# Patient Record
Sex: Female | Born: 1995 | Race: White | Hispanic: No | Marital: Single | Smoking: Former smoker
Health system: Southern US, Community
[De-identification: ages and names within clinical notes are randomized; demographics above are authoritative.]

## PROBLEM LIST (undated history)

## (undated) DIAGNOSIS — Z789 Other specified health status: Secondary | ICD-10-CM

## (undated) HISTORY — DX: Other specified health status: Z78.9

## (undated) HISTORY — PX: NO PAST SURGERIES: SHX2092

---
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
## 2017-07-20 LAB — OB RESULTS CONSOLE ANTIBODY SCREEN: Antibody Screen: NEGATIVE

## 2017-07-20 LAB — OB RESULTS CONSOLE RUBELLA ANTIBODY, IGM: Rubella: IMMUNE

## 2017-07-20 LAB — OB RESULTS CONSOLE RPR: RPR: NONREACTIVE

## 2017-07-20 LAB — OB RESULTS CONSOLE ABO/RH: RH TYPE: POSITIVE

## 2017-07-20 LAB — OB RESULTS CONSOLE HIV ANTIBODY (ROUTINE TESTING): HIV: NONREACTIVE

## 2017-07-20 LAB — OB RESULTS CONSOLE HEPATITIS B SURFACE ANTIGEN: HEP B S AG: NEGATIVE

## 2017-08-25 ENCOUNTER — Other Ambulatory Visit: Payer: Self-pay

## 2017-08-25 ENCOUNTER — Emergency Department
Admission: EM | Admit: 2017-08-25 | Discharge: 2017-08-25 | Disposition: A | Discharge status: Home / Self Care | Payer: Medicaid Other | Attending: Student in an Organized Health Care Education/Training Program | Admitting: Student in an Organized Health Care Education/Training Program

## 2017-08-25 ENCOUNTER — Encounter: Payer: Self-pay | Admitting: Emergency Medicine

## 2017-08-25 DIAGNOSIS — H60312 Diffuse otitis externa, left ear: Secondary | ICD-10-CM | POA: Insufficient documentation

## 2017-08-25 DIAGNOSIS — F17210 Nicotine dependence, cigarettes, uncomplicated: Secondary | ICD-10-CM | POA: Diagnosis not present

## 2017-08-25 DIAGNOSIS — H9202 Otalgia, left ear: Secondary | ICD-10-CM | POA: Diagnosis present

## 2017-08-25 MED ORDER — CLINDAMYCIN HCL 300 MG PO CAPS: 300.0000 mg | ORAL_CAPSULE | Freq: Three times a day (TID) | ORAL | 0 refills | Status: AC

## 2017-08-25 MED ORDER — CIPROFLOXACIN HCL 500 MG PO TABS: 500.0000 mg | ORAL_TABLET | Freq: Two times a day (BID) | ORAL | 0 refills | Status: DC

## 2017-08-25 MED ORDER — CIPROFLOXACIN HCL 0.3 % OP SOLN
2.0000 [drp] | Freq: Once | OPHTHALMIC | Status: AC
  Administered 2017-08-25: 2 [drp] via OTIC
  Filled 2017-08-25: qty 2.5

## 2017-08-25 MED ORDER — CLINDAMYCIN HCL 150 MG PO CAPS
300.0000 mg | ORAL_CAPSULE | Freq: Once | ORAL | Status: AC
  Administered 2017-08-25: 300 mg via ORAL
  Filled 2017-08-25: qty 2

## 2017-08-25 MED ORDER — CIPROFLOXACIN HCL 0.3 % OP SOLN: 1.0000 [drp] | OPHTHALMIC | 0 refills | Status: AC

## 2017-08-25 NOTE — ED Provider Notes (Signed)
[facility] Emergency Department Provider Note  ____________________________________________  Time seen: Approximately 11:34 PM  I have reviewed the triage vital signs and the nursing notes.   HISTORY  Chief Complaint Otalgia    HPI Samantha Vaughan is a 21 y.o. female presents to the emergency department with left ear pain.  Patient reports that she has had an edematous and tender left external auditory canal for the past week.  Patient reports that her symptoms started when she took a hairpin to the left external auditory canal to remove wax.  Patient has been taking Polytrim drops, which has not relieved her symptoms.  Patient has not had an ear wick in her left ear.  Patient also reports tenderness and edema in the postauricular region.   History reviewed. No pertinent past medical history.  There are no active problems to display for this patient.   History reviewed. No pertinent surgical history.  Prior to Admission medications   Medication Sig Start Date End Date Taking? Authorizing Provider  ciprofloxacin (CILOXAN) 0.3 % ophthalmic solution Place 1 drop into the left ear every 4 (four) hours while awake for 7 days. Administer 1 drop, every 2 hours, while awake, for 2 days. Then 1 drop, every 4 hours, while awake, for the next 5 days. 08/25/17 09/01/17  Orvil FeilWoods, Kanesha Cadle M, PA-C  ciprofloxacin (CIPRO) 500 MG tablet Take 1 tablet (500 mg total) by mouth 2 (two) times daily for 10 days. 08/25/17 09/04/17  Orvil FeilWoods, Mossie Gilder M, PA-C  clindamycin (CLEOCIN) 300 MG capsule Take 1 capsule (300 mg total) by mouth 3 (three) times daily for 10 days. 08/25/17 09/04/17  Orvil FeilWoods, Sharalyn Lomba M, PA-C    Allergies Patient has no known allergies.  No family history on file.  Social History Social History   Tobacco Use  . Smoking status: Current Every Day Smoker    Packs/day: 0.50    Types: Cigarettes  . Smokeless tobacco: Never Used  Substance Use Topics  . Alcohol use: No     Frequency: Never  . Drug use: Not on file     Review of Systems  Constitutional: No fever/chills Eyes: No visual changes. No discharge ENT: Patient has left ear pain.  Cardiovascular: no chest pain. Respiratory: no cough. No SOB. Musculoskeletal: Negative for musculoskeletal pain. Skin: Negative for rash, abrasions, lacerations, ecchymosis. Neurological: Negative for headaches, focal weakness or numbness.   ____________________________________________   PHYSICAL EXAM:  VITAL SIGNS: ED Triage Vitals  Enc Vitals Group     BP 08/25/17 1917 135/73     Pulse Rate 08/25/17 1917 97     Resp 08/25/17 1917 18     Temp 08/25/17 1917 98 F (36.7 C)     Temp Source 08/25/17 1917 Oral     SpO2 08/25/17 1917 100 %     Weight 08/25/17 1916 178 lb (80.7 kg)     Height 08/25/17 1916 5\' 2"  (1.575 m)     Head Circumference --      Peak Flow --      Pain Score 08/25/17 1916 10     Pain Loc --      Pain Edu? --      Excl. in GC? --      Constitutional: Alert and oriented. Well appearing and in no acute distress. Eyes: Conjunctivae are normal. PERRL. EOMI. Head: Atraumatic. ENT:      Ears: Left external auditory canal is boggy and edematous with evidence of white purulent exudate.  Left postauricular region is  edematous and dusky with no tenderness elicited over the mastoid process.      Nose: No congestion/rhinnorhea.      Mouth/Throat: Mucous membranes are moist.  Cardiovascular: Normal rate, regular rhythm. Normal S1 and S2.  Good peripheral circulation. Respiratory: Normal respiratory effort without tachypnea or retractions. Lungs CTAB. Good air entry to the bases with no decreased or absent breath sounds. Gastrointestinal: Bowel sounds 4 quadrants. Soft and nontender to palpation. No guarding or rigidity. No palpable masses. No distention. No CVA tenderness. Musculoskeletal: Full range of motion to all extremities. No gross deformities appreciated. Neurologic:  Normal speech  and language. No gross focal neurologic deficits are appreciated.  Skin:  Skin is warm, dry and intact. No rash noted. Psychiatric: Mood and affect are normal. Speech and behavior are normal. Patient exhibits appropriate insight and judgement.   ____________________________________________   LABS (all labs ordered are listed, but only abnormal results are displayed)  Labs Reviewed - No data to display ____________________________________________  EKG   ____________________________________________  RADIOLOGY  No results found.  ____________________________________________    PROCEDURES  Procedure(s) performed:    Procedures    Medications  ciprofloxacin (CILOXAN) 0.3 % ophthalmic solution 2 drop (2 drops Left EAR Given 08/25/17 2200)  clindamycin (CLEOCIN) capsule 300 mg (300 mg Oral Given 08/25/17 2221)     ____________________________________________   INITIAL IMPRESSION / ASSESSMENT AND PLAN / ED COURSE  Pertinent labs & imaging results that were available during my care of the patient were reviewed by me and considered in my medical decision making (see chart for details).  Review of the  [place]RS was performed in accordance of the [place] prior to dispensing any controlled drugs.    Assessment and plan Otitis externa Patient presents to the emergency department with edema and pain of the left external auditory canal with evidence of purulent exudate.  Patient was given ciprofloxacin  drops in the emergency department with the aid of an ear wick.  Patient was discharged with ciprofloxacin drops as well as Clindamycin.  Vital signs were reassuring prior to discharge.  All patient questions were answered. ____________________________________________  FINAL CLINICAL IMPRESSION(S) / ED DIAGNOSES  Final diagnoses:  Acute diffuse otitis externa of left ear      NEW MEDICATIONS STARTED DURING THIS VISIT:  ED Discharge Orders        Ordered    ciprofloxacin  (CILOXAN) 0.3 % ophthalmic solution  Every 4 hours while awake     08/25/17 2153    ciprofloxacin (CIPRO) 500 MG tablet  2 times daily     08/25/17 2153    clindamycin (CLEOCIN) 300 MG capsule  3 times daily     08/25/17 2208          This chart was dictated using voice recognition software/Dragon. Despite best efforts to proofread, errors can occur which can change the meaning. Any change was purely unintentional.    Orvil FeilWoods, Camber Ninh M, PA-C 08/25/17 2341    Willy Eddyobinson, Patrick, MD 08/25/17 2350

## 2017-08-25 NOTE — ED Triage Notes (Signed)
Patient ambulatory to triage with steady gait, without difficulty or distress noted; pt reports left earache; denies any recent illness

## 2017-08-25 NOTE — ED Notes (Signed)
Patient saw her PMD on Friday, 3 days ago, and was prescribed Neomycin and Polymyxin drops and Macrobid for a UTI.

## 2017-09-15 NOTE — L&D Delivery Note (Signed)
Delivery Note At 1:27 PM a viable and healthy female was delivered via Vaginal, Spontaneous (Presentation: LOA ).  APGAR: 9, 9; weight pending .   Placenta status:spontaneous ,intact .  Cord: 3 vessel with the following complications: none.    Anesthesia:  epidural Episiotomy: None Lacerations: 1st degree Suture Repair: vicryl rapide Est. Blood Loss (mL):  50  Mom to postpartum.  Baby to Couplet care / Skin to Skin.  Samantha Vaughan is a 21 y.o. female G2P1001 with IUP at [redacted]w[redacted]d admitted for rupture of membranes .  She progressed with augmentation to complete and pushed ~ 10 minutes to deliver.  Cord clamping delayed by several minutes then clamped by CNM and cut by FOB.  Placenta intact and spontaneous, bleeding minimal.  First degree labial and perineal lacerations repaired without difficulty.  Mom and baby stable prior to transfer to postpartum. She plans on breastfeeding.   Misty Stanley Leftwich-Kirby 01/13/2018, 1:55 PM

## 2017-12-15 LAB — OB RESULTS CONSOLE GC/CHLAMYDIA
Chlamydia: NEGATIVE
Gonorrhea: NEGATIVE

## 2017-12-18 LAB — OB RESULTS CONSOLE GBS: GBS: NEGATIVE

## 2018-01-13 ENCOUNTER — Inpatient Hospital Stay (HOSPITAL_COMMUNITY): Payer: Medicaid Other | Admitting: Anesthesiology

## 2018-01-13 ENCOUNTER — Encounter (HOSPITAL_COMMUNITY): Payer: Self-pay

## 2018-01-13 ENCOUNTER — Inpatient Hospital Stay (HOSPITAL_COMMUNITY)
Admission: AD | Admit: 2018-01-13 | Discharge: 2018-01-14 | DRG: 805 | Disposition: A | Discharge status: Home / Self Care | Payer: Medicaid Other | Source: Ambulatory Visit | Attending: Obstetrics and Gynecology | Admitting: Obstetrics and Gynecology

## 2018-01-13 DIAGNOSIS — O411230 Chorioamnionitis, third trimester, not applicable or unspecified: Secondary | ICD-10-CM | POA: Diagnosis not present

## 2018-01-13 DIAGNOSIS — O4292 Full-term premature rupture of membranes, unspecified as to length of time between rupture and onset of labor: Secondary | ICD-10-CM | POA: Diagnosis present

## 2018-01-13 DIAGNOSIS — F17210 Nicotine dependence, cigarettes, uncomplicated: Secondary | ICD-10-CM | POA: Diagnosis present

## 2018-01-13 DIAGNOSIS — O99334 Smoking (tobacco) complicating childbirth: Secondary | ICD-10-CM | POA: Diagnosis present

## 2018-01-13 DIAGNOSIS — Z3A40 40 weeks gestation of pregnancy: Secondary | ICD-10-CM | POA: Diagnosis not present

## 2018-01-13 LAB — COMPREHENSIVE METABOLIC PANEL
ALBUMIN: 3 g/dL — AB (ref 3.5–5.0)
ALK PHOS: 199 U/L — AB (ref 38–126)
ALT: 56 U/L — AB (ref 14–54)
AST: 33 U/L (ref 15–41)
Anion gap: 11 (ref 5–15)
BILIRUBIN TOTAL: 0.5 mg/dL (ref 0.3–1.2)
BUN: 8 mg/dL (ref 6–20)
CALCIUM: 9 mg/dL (ref 8.9–10.3)
CO2: 17 mmol/L — ABNORMAL LOW (ref 22–32)
Chloride: 105 mmol/L (ref 101–111)
Creatinine, Ser: 0.48 mg/dL (ref 0.44–1.00)
GFR calc Af Amer: >60 mL/min (ref >60)
GFR calc non Af Amer: >60 mL/min (ref >60)
GLUCOSE: 100 mg/dL — AB (ref 65–99)
POTASSIUM: 4.1 mmol/L (ref 3.5–5.1)
Sodium: 133 mmol/L — ABNORMAL LOW (ref 135–145)
TOTAL PROTEIN: 6.7 g/dL (ref 6.5–8.1)

## 2018-01-13 LAB — CBC
HEMATOCRIT: 32.7 % — AB (ref 36.0–46.0)
HEMOGLOBIN: 11.9 g/dL — AB (ref 12.0–15.0)
MCH: 32.1 pg (ref 26.0–34.0)
MCHC: 36.4 g/dL — ABNORMAL HIGH (ref 30.0–36.0)
MCV: 88.1 fL (ref 78.0–100.0)
Platelets: 253 10*3/uL (ref 150–400)
RBC: 3.71 MIL/uL — AB (ref 3.87–5.11)
RDW: 12.6 % (ref 11.5–15.5)
WBC: 11.7 10*3/uL — ABNORMAL HIGH (ref 4.0–10.5)

## 2018-01-13 LAB — POCT FERN TEST: POCT Fern Test: POSITIVE

## 2018-01-13 LAB — RPR: RPR Ser Ql: NONREACTIVE

## 2018-01-13 LAB — TYPE AND SCREEN
ABO/RH(D): A POS
Antibody Screen: NEGATIVE

## 2018-01-13 LAB — ABO/RH: ABO/RH(D): A POS

## 2018-01-13 MED ORDER — DIPHENHYDRAMINE HCL 50 MG/ML IJ SOLN: 12.5000 mg | INTRAMUSCULAR | Status: DC | PRN

## 2018-01-13 MED ORDER — LACTATED RINGERS IV SOLN: 500.0000 mL | INTRAVENOUS | Status: DC | PRN

## 2018-01-13 MED ORDER — MISOPROSTOL 25 MCG QUARTER TABLET: 25.0000 ug | ORAL_TABLET | ORAL | Status: DC | PRN

## 2018-01-13 MED ORDER — LIDOCAINE HCL (PF) 1 % IJ SOLN
INTRAMUSCULAR | Status: DC | PRN
  Administered 2018-01-13: 13 mL via EPIDURAL

## 2018-01-13 MED ORDER — FENTANYL 2.5 MCG/ML BUPIVACAINE 1/10 % EPIDURAL INFUSION (WH - ANES)
14.0000 mL/h | INTRAMUSCULAR | Status: DC | PRN
  Administered 2018-01-13 (×2): 14 mL/h via EPIDURAL
  Filled 2018-01-13 (×2): qty 100

## 2018-01-13 MED ORDER — OXYCODONE-ACETAMINOPHEN 5-325 MG PO TABS: 2.0000 | ORAL_TABLET | ORAL | Status: DC | PRN

## 2018-01-13 MED ORDER — OXYTOCIN BOLUS FROM INFUSION
500.0000 mL | Freq: Once | INTRAVENOUS | Status: AC
  Administered 2018-01-13: 500 mL via INTRAVENOUS

## 2018-01-13 MED ORDER — LACTATED RINGERS IV SOLN
INTRAVENOUS | Status: DC
  Administered 2018-01-13 (×3): via INTRAVENOUS

## 2018-01-13 MED ORDER — SIMETHICONE 80 MG PO CHEW: 80.0000 mg | CHEWABLE_TABLET | ORAL | Status: DC | PRN

## 2018-01-13 MED ORDER — ZOLPIDEM TARTRATE 5 MG PO TABS: 5.0000 mg | ORAL_TABLET | Freq: Every evening | ORAL | Status: DC | PRN

## 2018-01-13 MED ORDER — OXYTOCIN 40 UNITS IN LACTATED RINGERS INFUSION - SIMPLE MED
1.0000 m[IU]/min | INTRAVENOUS | Status: DC
  Administered 2018-01-13: 2 m[IU]/min via INTRAVENOUS
  Filled 2018-01-13: qty 1000

## 2018-01-13 MED ORDER — PHENYLEPHRINE 40 MCG/ML (10ML) SYRINGE FOR IV PUSH (FOR BLOOD PRESSURE SUPPORT)
80.0000 ug | PREFILLED_SYRINGE | INTRAVENOUS | Status: DC | PRN
  Filled 2018-01-13: qty 5

## 2018-01-13 MED ORDER — SENNOSIDES-DOCUSATE SODIUM 8.6-50 MG PO TABS
2.0000 | ORAL_TABLET | ORAL | Status: DC
  Administered 2018-01-13: 2 via ORAL
  Filled 2018-01-13: qty 2

## 2018-01-13 MED ORDER — LIDOCAINE HCL (PF) 1 % IJ SOLN
30.0000 mL | INTRAMUSCULAR | Status: DC | PRN
  Filled 2018-01-13: qty 30

## 2018-01-13 MED ORDER — DIBUCAINE 1 % RE OINT: 1.0000 "application " | TOPICAL_OINTMENT | RECTAL | Status: DC | PRN

## 2018-01-13 MED ORDER — OXYCODONE HCL 5 MG PO TABS: 10.0000 mg | ORAL_TABLET | ORAL | Status: DC | PRN

## 2018-01-13 MED ORDER — EPHEDRINE 5 MG/ML INJ
10.0000 mg | INTRAVENOUS | Status: DC | PRN
  Filled 2018-01-13: qty 2

## 2018-01-13 MED ORDER — LACTATED RINGERS IV SOLN: 500.0000 mL | Freq: Once | INTRAVENOUS | Status: DC

## 2018-01-13 MED ORDER — IBUPROFEN 600 MG PO TABS
600.0000 mg | ORAL_TABLET | Freq: Four times a day (QID) | ORAL | Status: DC
  Administered 2018-01-13 – 2018-01-14 (×4): 600 mg via ORAL
  Filled 2018-01-13 (×4): qty 1

## 2018-01-13 MED ORDER — WITCH HAZEL-GLYCERIN EX PADS: 1.0000 "application " | MEDICATED_PAD | CUTANEOUS | Status: DC | PRN

## 2018-01-13 MED ORDER — SODIUM CHLORIDE 0.9 % IV SOLN
2.0000 g | Freq: Four times a day (QID) | INTRAVENOUS | Status: DC
  Administered 2018-01-13: 2 g via INTRAVENOUS
  Filled 2018-01-13 (×2): qty 2000
  Filled 2018-01-13: qty 2
  Filled 2018-01-13 (×2): qty 2000

## 2018-01-13 MED ORDER — PHENYLEPHRINE 40 MCG/ML (10ML) SYRINGE FOR IV PUSH (FOR BLOOD PRESSURE SUPPORT)
80.0000 ug | PREFILLED_SYRINGE | INTRAVENOUS | Status: DC | PRN
  Filled 2018-01-13: qty 10
  Filled 2018-01-13: qty 5

## 2018-01-13 MED ORDER — BENZOCAINE-MENTHOL 20-0.5 % EX AERO
1.0000 "application " | INHALATION_SPRAY | CUTANEOUS | Status: DC | PRN
  Administered 2018-01-13: 1 via TOPICAL
  Filled 2018-01-13: qty 56

## 2018-01-13 MED ORDER — ACETAMINOPHEN 325 MG PO TABS: 650.0000 mg | ORAL_TABLET | ORAL | Status: DC | PRN

## 2018-01-13 MED ORDER — PRENATAL MULTIVITAMIN CH
1.0000 | ORAL_TABLET | Freq: Every day | ORAL | Status: DC
  Administered 2018-01-14: 1 via ORAL
  Filled 2018-01-13: qty 1

## 2018-01-13 MED ORDER — ACETAMINOPHEN 325 MG PO TABS
650.0000 mg | ORAL_TABLET | ORAL | Status: DC | PRN
  Administered 2018-01-13: 650 mg via ORAL
  Filled 2018-01-13: qty 2

## 2018-01-13 MED ORDER — SOD CITRATE-CITRIC ACID 500-334 MG/5ML PO SOLN: 30.0000 mL | ORAL | Status: DC | PRN

## 2018-01-13 MED ORDER — OXYTOCIN 40 UNITS IN LACTATED RINGERS INFUSION - SIMPLE MED: 2.5000 [IU]/h | INTRAVENOUS | Status: DC

## 2018-01-13 MED ORDER — OXYCODONE HCL 5 MG PO TABS: 5.0000 mg | ORAL_TABLET | ORAL | Status: DC | PRN

## 2018-01-13 MED ORDER — FENTANYL CITRATE (PF) 100 MCG/2ML IJ SOLN
100.0000 ug | INTRAMUSCULAR | Status: DC | PRN
  Administered 2018-01-13 (×2): 100 ug via INTRAVENOUS
  Filled 2018-01-13 (×3): qty 2

## 2018-01-13 MED ORDER — FLEET ENEMA 7-19 GM/118ML RE ENEM: 1.0000 | ENEMA | RECTAL | Status: DC | PRN

## 2018-01-13 MED ORDER — DEXTROSE 5 % IV SOLN
5.0000 mg/kg | Freq: Once | INTRAVENOUS | Status: DC
  Filled 2018-01-13: qty 8.5

## 2018-01-13 MED ORDER — ONDANSETRON HCL 4 MG/2ML IJ SOLN: 4.0000 mg | Freq: Four times a day (QID) | INTRAMUSCULAR | Status: DC | PRN

## 2018-01-13 MED ORDER — ONDANSETRON HCL 4 MG PO TABS: 4.0000 mg | ORAL_TABLET | ORAL | Status: DC | PRN

## 2018-01-13 MED ORDER — GENTAMICIN SULFATE 40 MG/ML IJ SOLN
5.0000 mg/kg | INTRAVENOUS | Status: DC
  Administered 2018-01-13: 340 mg via INTRAVENOUS
  Filled 2018-01-13: qty 8.5

## 2018-01-13 MED ORDER — TETANUS-DIPHTH-ACELL PERTUSSIS 5-2.5-18.5 LF-MCG/0.5 IM SUSP: 0.5000 mL | Freq: Once | INTRAMUSCULAR | Status: DC

## 2018-01-13 MED ORDER — DIPHENHYDRAMINE HCL 25 MG PO CAPS: 25.0000 mg | ORAL_CAPSULE | Freq: Four times a day (QID) | ORAL | Status: DC | PRN

## 2018-01-13 MED ORDER — ONDANSETRON HCL 4 MG/2ML IJ SOLN: 4.0000 mg | INTRAMUSCULAR | Status: DC | PRN

## 2018-01-13 MED ORDER — TERBUTALINE SULFATE 1 MG/ML IJ SOLN
0.2500 mg | Freq: Once | INTRAMUSCULAR | Status: DC | PRN
  Filled 2018-01-13: qty 1

## 2018-01-13 MED ORDER — COCONUT OIL OIL
1.0000 "application " | TOPICAL_OIL | Status: DC | PRN
  Administered 2018-01-14: 1 via TOPICAL
  Filled 2018-01-13: qty 120

## 2018-01-13 MED ORDER — OXYCODONE-ACETAMINOPHEN 5-325 MG PO TABS: 1.0000 | ORAL_TABLET | ORAL | Status: DC | PRN

## 2018-01-13 NOTE — Progress Notes (Signed)
Samantha Vaughan is a 22 y.o. G2P1001 at [redacted]w[redacted]d by ultrasound admitted for rupture of membranes  Subjective: Pt comfortable with epidural, feeling some pelvic pressure.  Objective: BP (!) 116/41   Pulse (!) 104   Temp (!) 102.9 F (39.4 C) (Axillary)   Resp 18   Ht  (1.575 m)   Wt 208 lb 12.8 oz (94.7 kg)   SpO2 98%   BMI 38.19 kg/m  No intake/output data recorded. No intake/output data recorded.  FHT:  FHR: 175 bpm, variability: moderate,  accelerations:  Present,  decelerations:  Present isolated variables UC:   regular, every 2-3 minutes SVE:   Dilation: 10 Effacement (%): 100 Station: Plus 2, Plus 3 Exam by:: Leftwich-Kirby, CNM  Labs: Lab Results  Component Value Date   WBC 11.7 (H) 01/13/2018   HGB 11.9 (L) 01/13/2018   HCT 32.7 (L) 01/13/2018   MCV 88.1 01/13/2018   PLT 253 01/13/2018    Assessment / Plan: Augmentation of labor, progressing well  Triple I  Labor: Progressing normally Preeclampsia:  n/a Fetal Wellbeing:  Category II Pain Control:  Epidural I/D:  Ampicillin 2 g Q 6 hours and Gentamycin IV per pharmacy consult started for Triple I Anticipated MOD:  NSVD  Oluwatobi Ruppe Leftwich-Kirby 01/13/2018, 1:19 PM

## 2018-01-13 NOTE — H&P (Signed)
OBSTETRIC ADMISSION HISTORY AND PHYSICAL  Samantha Vaughan is a 22 y.o. female G2P1001 with IUP at [redacted]w[redacted]d by patient report. Patient has received all of her prenatal care at [facility]. She was visiting a friend and has SROM at ~midnight. She was told to come to the nearest hospital. Attempting to obtain prenatal records. Per patient report her prenatal course has been very routine, and she is GBS negative. She reports +FMs, No LOF, no VB, no blurry vision, headaches or peripheral edema, and RUQ pain.  She plans on breast feeding. She is undecided but does desire birth control. She received her prenatal care at [facility]   Dating: By patient report --->  Estimated Date of Delivery: 01/11/18   Prenatal History/Complications:  Past Medical History: History reviewed. No pertinent past medical history.  Past Surgical History: History reviewed. No pertinent surgical history.  Obstetrical History: OB History    Gravida  2   Para  1   Term  1   Preterm      AB      Living  1     SAB      TAB      Ectopic      Multiple      Live Births              Social History: Social History   Socioeconomic History  . Marital status: Single    Spouse name: Not on file  . Number of children: Not on file  . Years of education: Not on file  . Highest education level: Not on file  Occupational History  . Not on file  Social Needs  . Financial resource strain: Not on file  . Food insecurity:    Worry: Not on file    Inability: Not on file  . Transportation needs:    Medical: Not on file    Non-medical: Not on file  Tobacco Use  . Smoking status: Current Every Day Smoker    Packs/day: 0.50    Types: Cigarettes  . Smokeless tobacco: Never Used  Substance and Sexual Activity  . Alcohol use: No    Frequency: Never  . Drug use: Not on file  . Sexual activity: Yes  Lifestyle  . Physical activity:    Days per week: Not on file    Minutes per session: Not on file  . Stress: Not  on file  Relationships  . Social connections:    Talks on phone: Not on file    Gets together: Not on file    Attends religious service: Not on file    Active member of club or organization: Not on file    Attends meetings of clubs or organizations: Not on file    Relationship status: Not on file  Other Topics Concern  . Not on file  Social History Narrative  . Not on file    Family History: History reviewed. No pertinent family history.  Allergies: No Known Allergies  Medications Prior to Admission  Medication Sig Dispense Refill Last Dose  . Prenatal Vit-Fe Fumarate-FA (MULTIVITAMIN-PRENATAL) 27-0.8 MG TABS tablet Take 1 tablet by mouth daily at 12 noon.        Review of Systems   All systems reviewed and negative except as stated in HPI  Blood pressure 133/63, pulse 89, temperature 98.3 F (36.8 C), temperature source Oral, resp. rate 20, height  (1.575 m), SpO2 98 %. General appearance: alert and cooperative Lungs: clear to auscultation bilaterally Heart: regular  rate and rhythm Abdomen: soft, non-tender; bowel sounds normal Extremities: Homans sign is negative, no sign of DVT Presentation: cephalic Fetal monitoringBaseline: 140 bpm, Variability: Good {> 6 bpm), Accelerations: Reactive and Decelerations: Absent Uterine activity ctx irregular 4-5 minutes Dilation: 2 Effacement (%): 40 Station: -2   Prenatal labs: ABO, Rh: --/--/PENDING (05/01 0233)normal per patient report Antibody: PENDING (05/01 0233)normal per patient report Rubella: Immune (11/05 0000) normal per patient report RPR: Nonreactive (11/05 0000)  Normal per patient report HBsAg: Negative (11/05 0000) normal per patient report HIV: Non-reactive (11/05 0000) normal per patient report GBS: Negative (04/05 0000) normal per patient report 1 hr Glucola normal per patient report Genetic screening  normal per patient report Anatomy US normal per patient report  Prenatal Transfer Tool   Maternal Diabetes: No Genetic Screening: Normal Maternal Ultrasounds/Referrals: Normal Fetal Ultrasounds or other Referrals:  None Maternal Substance Abuse:  No Significant Maternal Medications:  None Significant Maternal Lab Results: None  Results for orders placed or performed during the hospital encounter of 01/13/18 (from the past 24 hour(s))  POCT fern test   Collection Time: 01/13/18  2:07 AM  Result Value Ref Range   POCT Fern Test Positive = ruptured amniotic membanes   CBC   Collection Time: 01/13/18  2:33 AM  Result Value Ref Range   WBC 11.7 (H) 4.0 - 10.5 K/uL   RBC 3.71 (L) 3.87 - 5.11 MIL/uL   Hemoglobin 11.9 (L) 12.0 - 15.0 g/dL   HCT 16.1 (L) 09.6 - 04.5 %   MCV 88.1 78.0 - 100.0 fL   MCH 32.1 26.0 - 34.0 pg   MCHC 36.4 (H) 30.0 - 36.0 g/dL   RDW 40.9 81.1 - 91.4 %   Platelets 253 150 - 400 K/uL  Comprehensive metabolic panel   Collection Time: 01/13/18  2:33 AM  Result Value Ref Range   Sodium 133 (L) 135 - 145 mmol/L   Potassium 4.1 3.5 - 5.1 mmol/L   Chloride 105 101 - 111 mmol/L   CO2 17 (L) 22 - 32 mmol/L   Glucose, Bld 100 (H) 65 - 99 mg/dL   BUN 8 6 - 20 mg/dL   Creatinine, Ser 7.82 0.44 - 1.00 mg/dL   Calcium 9.0 8.9 - 95.6 mg/dL   Total Protein 6.7 6.5 - 8.1 g/dL   Albumin 3.0 (L) 3.5 - 5.0 g/dL   AST 33 15 - 41 U/L   ALT 56 (H) 14 - 54 U/L   Alkaline Phosphatase 199 (H) 38 - 126 U/L   Total Bilirubin 0.5 0.3 - 1.2 mg/dL   GFR calc non Af Amer >60 >60 mL/min   GFR calc Af Amer >60 >60 mL/min   Anion gap 11 5 - 15  Type and screen [facility]   Collection Time: 01/13/18  2:33 AM  Result Value Ref Range   ABO/RH(D) PENDING    Antibody Screen PENDING    Sample Expiration      01/16/2018 Performed at [facility], [address], [place], [place] [address]     Patient Active Problem List   Diagnosis Date Noted  . Labor and delivery, indication for care 01/13/2018    Assessment/Plan:  Samantha Vaughan is a 22 y.o.  G2P1001 at [redacted]w[redacted]d here for SROM. Patient received all prenatal care at [facility], currently in the process of obtaining those records. Per patient report there were no problems with prenatal care. If unable to obtain those reports will need to perform usual prenatal labs.  2/40/-3 at last check. Will begin with cytotec, if significantly ripens and dilates cervix may not need foley bulb.  #Labor:cytotec #Pain: Requests epidural #FWB: Cat 1 #ID:  n/a #MOF: breast #MOC:undecided #Circ:  no  Myrene Buddy, MD  01/13/2018, 3:12 AM

## 2018-01-13 NOTE — Anesthesia Preprocedure Evaluation (Signed)
Anesthesia Evaluation  Patient identified by MRN, date of birth, ID band Patient awake    Reviewed: Allergy & Precautions, NPO status , Patient's Chart, lab work & pertinent test results  Airway Mallampati: II  TM Distance: >3 FB Neck ROM: Full    Dental no notable dental hx.    Pulmonary neg pulmonary ROS, Current Smoker,    Pulmonary exam normal breath sounds clear to auscultation       Cardiovascular negative cardio ROS Normal cardiovascular exam Rhythm:Regular Rate:Normal     Neuro/Psych negative neurological ROS  negative psych ROS   GI/Hepatic negative GI ROS, Neg liver ROS,   Endo/Other  negative endocrine ROS  Renal/GU negative Renal ROS  negative genitourinary   Musculoskeletal negative musculoskeletal ROS (+)   Abdominal   Peds negative pediatric ROS (+)  Hematology negative hematology ROS (+)   Anesthesia Other Findings   Reproductive/Obstetrics negative OB ROS (+) Pregnancy                             Anesthesia Physical Anesthesia Plan  ASA: II  Anesthesia Plan: Epidural   Post-op Pain Management:    Induction:   PONV Risk Score and Plan:   Airway Management Planned:   Additional Equipment:   Intra-op Plan:   Post-operative Plan:   Informed Consent:   Plan Discussed with:   Anesthesia Plan Comments:         Anesthesia Quick Evaluation  

## 2018-01-13 NOTE — Anesthesia Pain Management Evaluation Note (Signed)
  CRNA Pain Management Visit Note  Patient: Samantha Vaughan, 22 y.o., female  "Hello I am a member of the anesthesia team at [facility]. We have an anesthesia team available at all times to provide care throughout the hospital, including epidural management and anesthesia for C-section. I don't know your plan for the delivery whether it a natural birth, water birth, IV sedation, nitrous supplementation, doula or epidural, but we want to meet your pain goals."   1.Was your pain managed to your expectations on prior hospitalizations?   Yes   2.What is your expectation for pain management during this hospitalization?     Epidural  3.How can we help you reach that goal?   Record the patient's initial score and the patient's pain goal.   Pain: 2  Pain Goal: 5 [facility] wants you to be able to say your pain was always managed very well.  Laban Emperor 01/13/2018

## 2018-01-13 NOTE — Progress Notes (Signed)
Subjective: Doing well. Has just received epidural.   Objective: BP (!) 127/51   Pulse 90   Temp (!) 97.5 F (36.4 C) (Oral)   Resp 16   Ht  (1.575 m)   Wt 94.7 kg (208 lb 12.8 oz)   SpO2 98%   BMI 38.19 kg/m  No intake/output data recorded. No intake/output data recorded.  FHT:  FHR: 150 bpm, variability: minimal ,  accelerations:  Abscent,  decelerations:  Absent UC:   regular, every 2-3 minutes SVE:   Dilation: 3.5 Effacement (%): 100 Station: -2 Exam by:: N Deal RN  Labs: Lab Results  Component Value Date   WBC 11.7 (H) 01/13/2018   HGB 11.9 (L) 01/13/2018   HCT 32.7 (L) 01/13/2018   MCV 88.1 01/13/2018   PLT 253 01/13/2018    Assessment / Plan: s/p SROM, labs from OSH still pending. On pitocin  Labor: Progressing on Pitocin Preeclampsia:  N/A Fetal Wellbeing:  Category I Pain Control:  Epidural I/D:  n/a Anticipated MOD:  NSVD  Myrene Buddy 01/13/2018, 7:38 AM

## 2018-01-13 NOTE — Progress Notes (Signed)
Jadaya Sommerfield is a 22 y.o. G2P1001 at [redacted]w[redacted]d by ultrasound admitted for rupture of membranes  Subjective:   Objective: BP (!) 116/41   Pulse (!) 104   Temp (!) 102.9 F (39.4 C) (Axillary)   Resp 18   Ht  (1.575 m)   Wt 208 lb 12.8 oz (94.7 kg)   SpO2 98%   BMI 38.19 kg/m  No intake/output data recorded. No intake/output data recorded.  FHT:  FHR: 150 bpm, variability: moderate,  accelerations:  Present,  decelerations:  Run of lates, resolved with position change, fluids  UC:   regular, every 2-3 minutes SVE:   5.5/70/-2 by RN exam  Labs: Lab Results  Component Value Date   WBC 11.7 (H) 01/13/2018   HGB 11.9 (L) 01/13/2018   HCT 32.7 (L) 01/13/2018   MCV 88.1 01/13/2018   PLT 253 01/13/2018    Assessment / Plan: Augmentation of labor, progressing well  Labor: Progressing normally Preeclampsia:  n/a Fetal Wellbeing:  Category I Pain Control:  Epidural I/D:  GBS neg Anticipated MOD:  NSVD  Karilyn Wind Leftwich-Kirby 01/13/2018, 1:14 PM

## 2018-01-13 NOTE — MAU Note (Signed)
Pt said she woke up around 0000 w/fluid leaking. Has continued. Now cntrx every 4 mins. Rating pain 4/10. Denies bleeding. +FM

## 2018-01-13 NOTE — Anesthesia Procedure Notes (Signed)
Epidural Patient location during procedure: OB Start time: 01/13/2018 7:03 AM End time: 01/13/2018 7:19 AM  Staffing Anesthesiologist: Lowella Curb, MD Performed: anesthesiologist   Preanesthetic Checklist Completed: patient identified, site marked, surgical consent, pre-op evaluation, timeout performed, IV checked, risks and benefits discussed and monitors and equipment checked  Epidural Patient position: sitting Prep: ChloraPrep Patient monitoring: heart rate, cardiac monitor, continuous pulse ox and blood pressure Approach: midline Location: L2-L3 Injection technique: LOR saline  Needle:  Needle type: Tuohy  Needle gauge: 17 G Needle length: 9 cm Needle insertion depth: 6 cm Catheter type: closed end flexible Catheter size: 20 Guage Catheter at skin depth: 10 cm Test dose: negative  Assessment Events: blood not aspirated, injection not painful, no injection resistance, negative IV test and no paresthesia  Additional Notes Reason for block:procedure for pain

## 2018-01-13 NOTE — Anesthesia Postprocedure Evaluation (Signed)
Anesthesia Post Note  Patient: Samantha Vaughan  Procedure(s) Performed: AN AD HOC LABOR EPIDURAL     Patient location during evaluation: Mother Baby Anesthesia Type: Epidural Level of consciousness: awake and alert Pain management: pain level controlled Vital Signs Assessment: post-procedure vital signs reviewed and stable Respiratory status: spontaneous breathing, nonlabored ventilation and respiratory function stable Cardiovascular status: stable Postop Assessment: no headache, no backache and epidural receding Anesthetic complications: no    Last Vitals:  Vitals:   01/13/18 1503 01/13/18 1600  BP: (!) 135/44 (!) 129/57  Pulse: (!) 104 (!) 103  Resp: 18 18  Temp:  37.6 C  SpO2:      Last Pain:  Vitals:   01/13/18 1600  TempSrc: Oral  PainSc: 0-No pain   Pain Goal:                 Anderson Middlebrooks

## 2018-01-13 NOTE — Lactation Note (Signed)
This note was copied from a baby's chart. Lactation Consultation Note  Patient Name: Samantha Vaughan [id number] Date: 01/13/2018 Reason for consult: Initial assessment;Term  G2P2 mother whose infant is now 78 hours old.  Infant is getting his hearing screen as I entered the room.  Mother has experience breastfeeding and she stated she is not sure how long she plans to breastfeed "Samantha Vaughan"    I encouraged STS, feeding 8-12 times/24 hours or earlier if he shows feeding cues (reviewed cues with mother), how to awaken a sleepy baby and how to latch on correctly.  Mother has no questions/concerns at this time and will call for assistance as needed.    Mom made aware of O/P services, breastfeeding support groups, community resources, and our phone # for post-discharge questions.    Maternal Data Formula Feeding for Exclusion: No Has patient been taught Hand Expression?: Yes Does the patient have breastfeeding experience prior to this delivery?: Yes  Feeding    LATCH Score                   Interventions Interventions: Breast feeding basics reviewed;Skin to skin  Lactation Tools Discussed/Used     Consult Status Consult Status: Follow-up Date: 01/14/18 Follow-up type: In-patient    Samantha Vaughan 01/13/2018, 8:05 PM

## 2018-01-14 MED ORDER — IBUPROFEN 600 MG PO TABS: 600.0000 mg | ORAL_TABLET | Freq: Four times a day (QID) | ORAL | 0 refills | Status: DC | PRN

## 2018-01-14 NOTE — Lactation Note (Signed)
This note was copied from a baby's chart. Lactation Consultation Note  Patient Name: Boy Julia Alkhatib [id number] Date: 01/14/2018 Reason for consult: Follow-up assessment;Infant weight loss  Baby is 21 hours old  LC reviewed and update the doc flow sheets  LC observed the baby latched on the left breast / multiple swallows/ when baby released nipple  Slightly slanted. LC assisted to latch on the right breast / football. Depth achieved. LC noted areola edema/ LC instructed on shells between feedings except when sleeping.  And reverse pressure after breast massage, hand express, reverse pressure.  Per mom active with WIC - [place]     Maternal Data Has patient been taught Hand Expression?: Yes  Feeding Feeding Type: Formula Length of feed: (multiple swallows )  LATCH Score Latch: Grasps breast easily, tongue down, lips flanged, rhythmical sucking.  Audible Swallowing: Spontaneous and intermittent  Type of Nipple: Everted at rest and after stimulation  Comfort (Breast/Nipple): Soft / non-tender  Hold (Positioning): Assistance needed to correctly position infant at breast and maintain latch.  LATCH Score: 9  Interventions Interventions: Assisted with latch;Breast feeding basics reviewed;Skin to skin;Breast massage;Hand express;Reverse pressure;Breast compression;Adjust position;Support pillows;Position options  Lactation Tools Discussed/Used Tools: Shells Shell Type: Inverted WIC Program: Yes   Consult Status Consult Status: Follow-up Date: 01/15/18 Follow-up type: In-patient    Matilde Sprang Makaley Storts 01/14/2018, 11:09 AM

## 2018-01-14 NOTE — Discharge Instructions (Signed)
Vaginal Delivery, Care After °Refer to this sheet in the next few weeks. These instructions provide you with information about caring for yourself after vaginal delivery. Your health care provider may also give you more specific instructions. Your treatment has been planned according to current medical practices, but problems sometimes occur. Call your health care provider if you have any problems or questions. °What can I expect after the procedure? °After vaginal delivery, it is common to have: °· Some bleeding from your vagina. °· Soreness in your abdomen, your vagina, and the area of skin between your vaginal opening and your anus (perineum). °· Pelvic cramps. °· Fatigue. ° °Follow these instructions at home: °Medicines °· Take over-the-counter and prescription medicines only as told by your health care provider. °· If you were prescribed an antibiotic medicine, take it as told by your health care provider. Do not stop taking the antibiotic until it is finished. °Driving ° °· Do not drive or operate heavy machinery while taking prescription pain medicine. °· Do not drive for 24 hours if you received a sedative. °Lifestyle °· Do not drink alcohol. This is especially important if you are breastfeeding or taking medicine to relieve pain. °· Do not use tobacco products, including cigarettes, chewing tobacco, or e-cigarettes. If you need help quitting, ask your health care provider. °Eating and drinking °· Drink at least 8 eight-ounce glasses of water every day unless you are told not to by your health care provider. If you choose to breastfeed your baby, you may need to drink more water than this. °· Eat high-fiber foods every day. These foods may help prevent or relieve constipation. High-fiber foods include: °? Whole grain cereals and breads. °? Brown rice. °? Beans. °? Fresh fruits and vegetables. °Activity °· Return to your normal activities as told by your health care provider. Ask your health care provider  what activities are safe for you. °· Rest as much as possible. Try to rest or take a nap when your baby is sleeping. °· Do not lift anything that is heavier than your baby or 10 lb (4.5 kg) until your health care provider says that it is safe. °· Talk with your health care provider about when you can engage in sexual activity. This may depend on your: °? Risk of infection. °? Rate of healing. °? Comfort and desire to engage in sexual activity. °Vaginal Care °· If you have an episiotomy or a vaginal tear, check the area every day for signs of infection. Check for: °? More redness, swelling, or pain. °? More fluid or blood. °? Warmth. °? Pus or a bad smell. °· Do not use tampons or douches until your health care provider says this is safe. °· Watch for any blood clots that may pass from your vagina. These may look like clumps of dark red, brown, or black discharge. °General instructions °· Keep your perineum clean and dry as told by your health care provider. °· Wear loose, comfortable clothing. °· Wipe from front to back when you use the toilet. °· Ask your health care provider if you can shower or take a bath. If you had an episiotomy or a perineal tear during labor and delivery, your health care provider may tell you not to take baths for a certain length of time. °· Wear a bra that supports your breasts and fits you well. °· If possible, have someone help you with household activities and help care for your baby for at least a few days after   you leave the hospital. °· Keep all follow-up visits for you and your baby as told by your health care provider. This is important. °Contact a health care provider if: °· You have: °? Vaginal discharge that has a bad smell. °? Difficulty urinating. °? Pain when urinating. °? A sudden increase or decrease in the frequency of your bowel movements. °? More redness, swelling, or pain around your episiotomy or vaginal tear. °? More fluid or blood coming from your episiotomy or  vaginal tear. °? Pus or a bad smell coming from your episiotomy or vaginal tear. °? A fever. °? A rash. °? Little or no interest in activities you used to enjoy. °? Questions about caring for yourself or your baby. °· Your episiotomy or vaginal tear feels warm to the touch. °· Your episiotomy or vaginal tear is separating or does not appear to be healing. °· Your breasts are painful, hard, or turn red. °· You feel unusually sad or worried. °· You feel nauseous or you vomit. °· You pass large blood clots from your vagina. If you pass a blood clot from your vagina, save it to show to your health care provider. Do not flush blood clots down the toilet without having your health care provider look at them. °· You urinate more than usual. °· You are dizzy or light-headed. °· You have not breastfed at all and you have not had a menstrual period for 12 weeks after delivery. °· You have stopped breastfeeding and you have not had a menstrual period for 12 weeks after you stopped breastfeeding. °Get help right away if: °· You have: °? Pain that does not go away or does not get better with medicine. °? Chest pain. °? Difficulty breathing. °? Blurred vision or spots in your vision. °? Thoughts about hurting yourself or your baby. °· You develop pain in your abdomen or in one of your legs. °· You develop a severe headache. °· You faint. °· You bleed from your vagina so much that you fill two sanitary pads in one hour. °This information is not intended to replace advice given to you by your health care provider. Make sure you discuss any questions you have with your health care provider. °Document Released: 08/29/2000 Document Revised: 02/13/2016 Document Reviewed: 09/16/2015 °Elsevier Interactive Patient Education © 2018 Elsevier Inc. ° °

## 2018-01-14 NOTE — Progress Notes (Signed)
Post Partum Day 1 Subjective: Samantha Vaughan is a 22 yo female G2P2 that is here after a NSVD. She is GBS negative and Rh positive. Her pregnancy was complicated with choioamionitis that was treated with ampicillin and gentamicin.   Today she has no complaints. She is ambulating, tolerating PO, +flatus, - BM, pain well controlled, bleeding is improving. She wants to try Depo as she states she is forgetful and thinks this will be the best option for her. She is breastfeeding.  Objective: Blood pressure 95/72, pulse 86, temperature 98.6 F (37 C), temperature source Oral, resp. rate 18, height  (1.575 m), weight 94.7 kg (208 lb 12.8 oz), SpO2 99 %, unknown if currently breastfeeding.  Physical Exam:  General: alert and cooperative Lochia: appropriate Uterine Fundus: firm Incision: N/A DVT Evaluation: No evidence of DVT seen on physical exam.  Recent Labs    01/13/18 0233  HGB 11.9*  HCT 32.7*    Assessment/Plan: Plan for discharge tomorrow, Breastfeeding and Contraception Depo   LOS: 1 day   Beverly Sessions 01/14/2018, 7:39 AM

## 2018-01-14 NOTE — Discharge Summary (Signed)
OB Discharge Summary     Patient Name: Samantha Vaughan DOB: 12/27/1995 MRN: [id number]  Date of admission: 01/13/2018 Delivering MD: Sharen Counter A   Date of discharge: 01/14/2018  Admitting diagnosis: 40 WEEKS CTX Intrauterine pregnancy: [redacted]w[redacted]d     Secondary diagnosis:  Active Problems:   Labor and delivery, indication for care   Chorioamnionitis, third trimester, not applicable or unspecified   NSVD (normal spontaneous vaginal delivery)  Additional problems: GBS neg; prenatal care in [place]     Discharge diagnosis: Term Pregnancy Delivered                                                                                                Post partum procedures:none  Augmentation: Pitocin  Complications: Intrauterine Inflammation or infection (Chorioamniotis)  Hospital course:  Induction of Labor With Vaginal Delivery   22 y.o. yo [id number] at [redacted]w[redacted]d was admitted to the hospital 01/13/2018 for induction of labor.  Indication for induction: PROM. Pt had her PNC in [place] but had PROM in [place] while visiting a friend and came to the nearest facility. Patient had a labor course remarkable for becoming febrile requiring a dose of Amp and Gent while in labor. Membrane Rupture Time/Date: 12:00 PM ,01/13/2018   Intrapartum Procedures: Episiotomy: None [1]                                         Lacerations:  1st degree [2]  Patient had delivery of a Viable infant.  Information for the patient's newborn:  Rei, Contee [[id number]]  Delivery Method: Vaginal, Spontaneous(Filed from Delivery Summary)   01/13/2018  Details of delivery can be found in separate delivery note.  Patient had a routine postpartum course and has remained afebrile with stable VS. Patient is discharged home 01/14/18 per her request for early d/c.  Physical exam  Vitals:   01/13/18 1600 01/13/18 1712 01/13/18 2053 01/14/18 0508  BP: (!) 129/57 (!) 109/44 134/62 95/72  Pulse: (!) 103 (!) 108 (!) 105 86   Resp: Temp: 99.6 F (37.6 C) 98.9 F (37.2 C) 98.6 F (37 C) 98.6 F (37 C)  TempSrc: Oral Oral Oral Oral  SpO2:   99% 99%  Weight:      Height:       General: alert and cooperative Lochia: appropriate Uterine Fundus: firm Incision: N/A DVT Evaluation: No evidence of DVT seen on physical exam. Labs: Lab Results  Component Value Date   WBC 11.7 (H) 01/13/2018   HGB 11.9 (L) 01/13/2018   HCT 32.7 (L) 01/13/2018   MCV 88.1 01/13/2018   PLT 253 01/13/2018   CMP Latest Ref Rng & Units 01/13/2018  Glucose 65 - 99 mg/dL 130(Q)  BUN 6 - 20 mg/dL 8  Creatinine 6.57 - 8.46 mg/dL 9.62  Sodium 952 - 841 mmol/L 133(L)  Potassium 3.5 - 5.1 mmol/L 4.1  Chloride 101 - 111 mmol/L 105  CO2 22 - 32 mmol/L 17(L)  Calcium 8.9 - 10.3  mg/dL 9.0  Total Protein 6.5 - 8.1 g/dL 6.7  Total Bilirubin 0.3 - 1.2 mg/dL 0.5  Alkaline Phos 38 - 126 U/L 199(H)  AST 15 - 41 U/L 33  ALT 14 - 54 U/L 56(H)    Discharge instruction: per After Visit Summary and "Baby and Me Booklet".  After visit meds:  Allergies as of 01/14/2018   No Known Allergies     Medication List    STOP taking these medications   calcium carbonate 500 MG chewable tablet Commonly known as:  TUMS - dosed in mg elemental calcium     TAKE these medications   ibuprofen 600 MG tablet Commonly known as:  ADVIL,MOTRIN Take 1 tablet (600 mg total) by mouth every 6 (six) hours as needed.   multivitamin-prenatal 27-0.8 MG Tabs tablet Take 1 tablet by mouth daily at 12 noon.       Diet: routine diet  Activity: Advance as tolerated. Pelvic rest for 6 weeks.   Outpatient follow up:4 weeks Follow up Appt:No future appointments. Follow up Visit:No follow-ups on file.  Postpartum contraception: Depo Provera  Newborn Data: Live born female  Birth Weight: 8 lb 12.9 oz (3994 g) APGAR: 9, 9  Newborn Delivery   Birth date/time:  01/13/2018 13:27:00 Delivery type:  Vaginal, Spontaneous     Baby Feeding:  Breast Disposition:home with mother   01/14/2018 Cam Hai, CNM 9:11 AM

## 2018-01-15 ENCOUNTER — Ambulatory Visit: Payer: Self-pay

## 2018-01-15 NOTE — Lactation Note (Signed)
This note was copied from a baby's chart. Lactation Consultation Note  Patient Name: Samantha Vaughan [id number] Date: 01/15/2018 Reason for consult: Follow-up assessment   P2, Baby 44 hours old and mother is breastfeeding and formula feeding. Encouraged breastfeeding before offering formula to help establish milk supply. Mother has abrasion on L nipple and she is using coconut oil.   Reviewed hand expression with drops expressed and suggest applying to nipple for soreness. Observed feeding with guidance on depth and breast compression. Mom encouraged to feed baby 8-12 times/24 hours and with feeding cues.  Reviewed engorgement care and monitoring voids/stools.    Maternal Data    Feeding Feeding Type: Breast Fed Length of feed: 20 min  LATCH Score Latch: Grasps breast easily, tongue down, lips flanged, rhythmical sucking.  Audible Swallowing: A few with stimulation  Type of Nipple: Everted at rest and after stimulation  Comfort (Breast/Nipple): Filling, red/small blisters or bruises, mild/mod discomfort  Hold (Positioning): No assistance needed to correctly position infant at breast.  LATCH Score: 8  Interventions Interventions: Hand express;Breast compression  Lactation Tools Discussed/Used Tools: Coconut oil   Consult Status Consult Status: Complete    Hardie Pulley 01/15/2018, 9:53 AM

## 2018-06-04 ENCOUNTER — Ambulatory Visit (INDEPENDENT_AMBULATORY_CARE_PROVIDER_SITE_OTHER): Payer: Medicaid Other | Admitting: Family

## 2018-06-04 ENCOUNTER — Encounter: Payer: Self-pay | Admitting: Family

## 2018-06-04 VITALS — BP 102/57 | HR 71 | Temp 97.6°F | Ht 61.5 in | Wt 196.2 lb

## 2018-06-04 DIAGNOSIS — F17200 Nicotine dependence, unspecified, uncomplicated: Secondary | ICD-10-CM | POA: Diagnosis not present

## 2018-06-04 DIAGNOSIS — E669 Obesity, unspecified: Secondary | ICD-10-CM | POA: Diagnosis not present

## 2018-06-04 DIAGNOSIS — H60501 Unspecified acute noninfective otitis externa, right ear: Secondary | ICD-10-CM | POA: Diagnosis not present

## 2018-06-04 DIAGNOSIS — L732 Hidradenitis suppurativa: Secondary | ICD-10-CM

## 2018-06-04 MED ORDER — OFLOXACIN 0.3 % OT SOLN: 10.0000 [drp] | Freq: Every day | OTIC | 0 refills | Status: DC

## 2018-06-04 MED ORDER — DOXYCYCLINE HYCLATE 100 MG PO TABS: 100.0000 mg | ORAL_TABLET | Freq: Two times a day (BID) | ORAL | 1 refills | Status: DC

## 2018-06-04 MED ORDER — CLINDAMYCIN PHOSPHATE 1 % EX SOLN: Freq: Two times a day (BID) | CUTANEOUS | 2 refills | Status: DC

## 2018-06-04 NOTE — Progress Notes (Signed)
Subjective:    Patient ID: Samantha Vaughan, female    DOB: Jan 07, 1996, 22 y.o.   MRN: [id number]  Chief Complaint  Patient presents with  . New Patient (Initial Visit)    Patient would like to talk about [facility] options.  . Establish Care  . Rash    bilateral armpits- Patient states that they have been going on since patient was 16 and never go away.  . Ear Drainage    right x 3 days    Rash  This is a chronic problem. The current episode started more than 1 year ago. The problem has been waxing and waning since onset. Location: bilateral axilla  The rash is characterized by itchiness and redness. Associated with: shaving makes it worse. Associated symptoms include coughing. Pertinent negatives include no rhinorrhea or sore throat. Past treatments include antibiotic cream, anti-itch cream and moisturizer (antibiotics ). The treatment provided no relief.  Ear Drainage   There is pain in the right ear. This is a new problem. The current episode started in the past 7 days. The problem occurs constantly. The problem has been unchanged. There has been no fever. The pain is at a severity of 0/10. The patient is experiencing no pain. Associated symptoms include coughing, ear discharge, headaches and a rash. Pertinent negatives include no hearing loss, rhinorrhea or sore throat. She has tried nothing for the symptoms. The treatment provided no relief.      Review of Systems  HENT: Positive for ear discharge. Negative for hearing loss, rhinorrhea and sore throat.   Respiratory: Positive for cough.   Skin: Positive for rash.  Neurological: Positive for headaches.  All other systems reviewed and are negative.      Objective:   Physical Exam  Constitutional: She is oriented to person, place, and time. She appears well-developed and well-nourished. No distress.  HENT:  Head: Normocephalic and atraumatic.  Right Ear: There is drainage, swelling and tenderness.  Left Ear: External ear normal.    Mouth/Throat: Oropharynx is clear and moist.  Eyes: Pupils are equal, round, and reactive to light.  Neck: Normal range of motion. Neck supple. No thyromegaly present.  Cardiovascular: Normal rate, regular rhythm, normal heart sounds and intact distal pulses.  No murmur heard. Pulmonary/Chest: Effort normal and breath sounds normal. No respiratory distress. She has no wheezes.  Abdominal: Soft. Bowel sounds are normal. She exhibits no distension. There is no tenderness.  Musculoskeletal: Normal range of motion. She exhibits no edema or tenderness.  Neurological: She is alert and oriented to person, place, and time. She has normal reflexes. No cranial nerve deficit.  Skin: Skin is warm and dry. Rash noted. Rash is pustular.     Psychiatric: She has a normal mood and affect. Her behavior is normal. Judgment and thought content normal.  Vitals reviewed.     BP (!) 102/57   Pulse 71   Temp 97.6 F (36.4 C) (Oral)   Ht 5' 1.5" (1.562 m)   Wt 196 lb 3.2 oz (89 kg)   LMP 05/10/2018 (Approximate)   BMI 36.47 kg/m      Assessment & Plan:  Samantha Vaughan comes in today with chief complaint of New Patient (Initial Visit) (Patient would like to talk about [facility] options.); Establish Care; Rash (bilateral armpits- Patient states that they have been going on since patient was 16 and never go away.); and Ear Drainage (right x 3 days)   Diagnosis and orders addressed:  1. Hidradenitis suppurativa Do not pick  Start doxycyline for 8 weeks and clindamycin RTO in 2 months  - doxycycline (VIBRA-TABS) 100 MG tablet; Take 1 tablet (100 mg total) by mouth 2 (two) times daily.  Dispense: 60 tablet; Refill: 1 - clindamycin (CLEOCIN-T) 1 % external solution; Apply topically 2 (two) times daily.  Dispense: 300 mL; Refill: 2  2. Acute otitis externa of right ear, unspecified type Do not stick anything into ear Avoid exposure to dirty water - ofloxacin (FLOXIN OTIC) 0.3 % OTIC solution; Place 10 drops  into the right ear daily.  Dispense: 5 mL; Refill: 0  3. Current smoker Smoking cessation disucssed  4. Obesity (BMI 30-39.9)     Jannifer Rodneyhristy Ziona Wickens, FNP

## 2018-06-04 NOTE — Patient Instructions (Signed)
Hidradenitis Suppurativa Hidradenitis suppurativa is a long-term (chronic) skin disease that starts with blocked sweat glands or hair follicles. Bacteria may grow in these blocked openings of your skin. Hidradenitis suppurativa is like a severe form of acne that develops in areas of your body where acne would be unusual. It is most likely to affect the areas of your body where skin rubs against skin and becomes moist. This includes your:  Underarms.  Groin.  Genital areas.  Buttocks.  Upper thighs.  Breasts.  Hidradenitis suppurativa may start out with small pimples. The pimples can develop into deep sores that break open (rupture) and drain pus. Over time your skin may thicken and become scarred. Hidradenitis suppurativa cannot be passed from person to person. What are the causes? The exact cause of hidradenitis suppurativa is not known. This condition may be due to:  Female and female hormones. The condition is rare before and after puberty.  An overactive body defense system (immune system). Your immune system may overreact to the blocked hair follicles or sweat glands and cause swelling and pus-filled sores.  What increases the risk? You may have a higher risk of hidradenitis suppurativa if you:  Are a woman.  Are between ages 11 and 55.  Have a family history of hidradenitis suppurativa.  Have a personal history of acne.  Are overweight.  Smoke.  Take the drug lithium.  What are the signs or symptoms? The first signs of an outbreak are usually painful skin bumps that look like pimples. As the condition progresses:  Skin bumps may get bigger and grow deeper into the skin.  Bumps under the skin may rupture and drain smelly pus.  Skin may become itchy and infected.  Skin may thicken and scar.  Drainage may continue through tunnels under the skin (fistulas).  Walking and moving your arms can become painful.  How is this diagnosed? Your health care provider may  diagnose hidradenitis suppurativa based on your medical history and your signs and symptoms. A physical exam will also be done. You may need to see a health care provider who specializes in skin diseases (dermatologist). You may also have tests done to confirm the diagnosis. These can include:  Swabbing a sample of pus or drainage from your skin so it can be sent to the lab and tested for infection.  Blood tests to check for infection.  How is this treated? The same treatment will not work for everybody with hidradenitis suppurativa. Your treatment will depend on how severe your symptoms are. You may need to try several treatments to find what works best for you. Part of your treatment may include cleaning and bandaging (dressing) your wounds. You may also have to take medicines, such as the following:  Antibiotics.  Acne medicines.  Medicines to block or suppress the immune system.  A diabetes medicine (metformin) is sometimes used to treat this condition.  For women, birth control pills can sometimes help relieve symptoms.  You may need surgery if you have a severe case of hidradenitis suppurativa that does not respond to medicine. Surgery may involve:  Using a laser to clear the skin and remove hair follicles.  Opening and draining deep sores.  Removing the areas of skin that are diseased and scarred.  Follow these instructions at home:  Learn as much as you can about your disease, and work closely with your health care providers.  Take medicines only as directed by your health care provider.  If you were prescribed   an antibiotic medicine, finish it all even if you start to feel better.  If you are overweight, losing weight may be very helpful. Try to reach and maintain a healthy weight.  Do not use any tobacco products, including cigarettes, chewing tobacco, or electronic cigarettes. If you need help quitting, ask your health care provider.  Do not shave the areas where you  get hidradenitis suppurativa.  Do not wear deodorant.  Wear loose-fitting clothes.  Try not to overheat and get sweaty.  Take a daily bleach bath as directed by your health care provider. ? Fill your bathtub halfway with water. ? Pour in  cup of unscented household bleach. ? Soak for 5-10 minutes.  Cover sore areas with a warm, clean washcloth (compress) for 5-10 minutes. Contact a health care provider if:  You have a flare-up of hidradenitis suppurativa.  You have chills or a fever.  You are having trouble controlling your symptoms at home. This information is not intended to replace advice given to you by your health care provider. Make sure you discuss any questions you have with your health care provider. Document Released: 04/15/2004 Document Revised: 02/07/2016 Document Reviewed: 12/02/2013 Elsevier Interactive Patient Education  2018 Elsevier Inc.  

## 2018-06-14 ENCOUNTER — Ambulatory Visit: Payer: Self-pay | Admitting: Family Medicine

## 2018-08-04 ENCOUNTER — Ambulatory Visit: Payer: Medicaid Other | Admitting: Family

## 2018-09-17 ENCOUNTER — Telehealth: Payer: Self-pay | Admitting: Family

## 2018-09-17 NOTE — Telephone Encounter (Signed)
Pt scheduled with Jannifer Rodney 1/17 at 10:10 to discuss OCP.

## 2018-09-17 NOTE — Telephone Encounter (Signed)
Mom is on DPR as of 05/2018. LM for mom to call back - it doesn't look like they discussed BC in the note from [place] that day.

## 2018-09-28 ENCOUNTER — Other Ambulatory Visit: Payer: Self-pay | Admitting: Family

## 2018-09-28 DIAGNOSIS — L732 Hidradenitis suppurativa: Secondary | ICD-10-CM

## 2018-10-01 ENCOUNTER — Ambulatory Visit: Payer: Medicaid Other | Admitting: Family

## 2018-10-01 ENCOUNTER — Encounter: Payer: Self-pay | Admitting: Family

## 2018-10-01 VITALS — BP 110/75 | HR 75 | Temp 96.9°F | Ht 61.5 in | Wt 188.4 lb

## 2018-10-01 DIAGNOSIS — Z3009 Encounter for other general counseling and advice on contraception: Secondary | ICD-10-CM | POA: Diagnosis not present

## 2018-10-01 DIAGNOSIS — F17200 Nicotine dependence, unspecified, uncomplicated: Secondary | ICD-10-CM

## 2018-10-01 DIAGNOSIS — N898 Other specified noninflammatory disorders of vagina: Secondary | ICD-10-CM | POA: Diagnosis not present

## 2018-10-01 LAB — PREGNANCY, URINE: Preg Test, Ur: NEGATIVE

## 2018-10-01 LAB — WET PREP FOR TRICH, YEAST, CLUE
CLUE CELL EXAM: NEGATIVE
Trichomonas Exam: NEGATIVE
Yeast Exam: NEGATIVE

## 2018-10-01 MED ORDER — NORGESTIMATE-ETH ESTRADIOL 0.25-35 MG-MCG PO TABS: 1.0000 | ORAL_TABLET | Freq: Every day | ORAL | 11 refills | Status: DC

## 2018-10-01 NOTE — Progress Notes (Signed)
Subjective:    Patient ID: Samantha Vaughan, female    DOB: 07-Aug-1996, 23 y.o.   MRN: [id number]  Chief Complaint  Patient presents with  . wants birth control   Pt presents to the office today today to discuss birth control options. She states she has been on birth control patch before, but states she became pregnant with her son on this and does not wish to restart.   She does not wish to have any more children in the near future and would like a long term option for birth control.  She is a current smoker, but wishes to stop. She denies any hx of DVT or family hx.  Vaginal Discharge  The patient's primary symptoms include genital itching and vaginal discharge. The patient's pertinent negatives include no genital odor. The current episode started more than 1 year ago. The problem occurs intermittently. The problem has been waxing and waning. Pertinent negatives include no back pain, constipation, discolored urine or painful intercourse. The vaginal discharge was normal. The vaginal bleeding is spotting. Exacerbated by: used a douche last week.        Review of Systems  Gastrointestinal: Negative for constipation.  Genitourinary: Positive for vaginal discharge.  Musculoskeletal: Negative for back pain.  All other systems reviewed and are negative.      Objective:   Physical Exam Vitals signs reviewed.  Constitutional:      General: She is not in acute distress.    Appearance: She is well-developed.  HENT:     Head: Normocephalic and atraumatic.  Eyes:     Pupils: Pupils are equal, round, and reactive to light.  Neck:     Musculoskeletal: Normal range of motion and neck supple.     Thyroid: No thyromegaly.  Cardiovascular:     Rate and Rhythm: Normal rate and regular rhythm.     Heart sounds: Normal heart sounds. No murmur.  Pulmonary:     Effort: Pulmonary effort is normal. No respiratory distress.     Breath sounds: Normal breath sounds. No wheezing.  Abdominal:   General: Bowel sounds are normal. There is no distension.     Palpations: Abdomen is soft.     Tenderness: There is no abdominal tenderness.  Musculoskeletal: Normal range of motion.        General: No tenderness.  Skin:    General: Skin is warm and dry.  Neurological:     Mental Status: She is alert and oriented to person, place, and time.     Cranial Nerves: No cranial nerve deficit.     Deep Tendon Reflexes: Reflexes are normal and symmetric.  Psychiatric:        Behavior: Behavior normal.        Thought Content: Thought content normal.        Judgment: Judgment normal.     BP 110/75   Pulse 75   Temp (!) 96.9 F (36.1 C) (Oral)   Ht 5' 1.5" (1.562 m)   Wt 188 lb 6.4 oz (85.5 kg)   LMP 09/14/2018   BMI 35.02 kg/m      Assessment & Plan:  Samantha Vaughan comes in today with chief complaint of wants birth control   Diagnosis and orders addressed:  1. Vaginal irritation Start probiotic  - Pregnancy, urine - WET PREP FOR TRICH, YEAST, CLUE  2. Birth control counseling Safe sex Start OC daily, schedule IUD appt in 4 weeks Risk of DVT discussed, smoking cessation given - Pregnancy, urine -  WET PREP FOR TRICH, YEAST, CLUE - norgestimate-ethinyl estradiol (SPRINTEC 28) 0.25-35 MG-MCG tablet; Take 1 tablet by mouth daily.  Dispense: 1 Package; Refill: 11  3. Current smoker Smoking cessation discussed  Jannifer Rodney, FNP

## 2018-10-01 NOTE — Patient Instructions (Signed)

## 2018-10-29 ENCOUNTER — Encounter: Payer: Medicaid Other | Admitting: Family Medicine

## 2018-12-27 ENCOUNTER — Other Ambulatory Visit: Payer: Self-pay

## 2018-12-27 ENCOUNTER — Ambulatory Visit (INDEPENDENT_AMBULATORY_CARE_PROVIDER_SITE_OTHER): Payer: Medicaid Other | Admitting: Nurse Practitioner

## 2018-12-27 ENCOUNTER — Encounter: Payer: Self-pay | Admitting: Nurse Practitioner

## 2018-12-27 DIAGNOSIS — N3000 Acute cystitis without hematuria: Secondary | ICD-10-CM | POA: Diagnosis not present

## 2018-12-27 MED ORDER — NITROFURANTOIN MONOHYD MACRO 100 MG PO CAPS: 100.0000 mg | ORAL_CAPSULE | Freq: Two times a day (BID) | ORAL | 0 refills | Status: DC

## 2018-12-27 NOTE — Progress Notes (Signed)
Patient ID: Samantha Vaughan, female   DOB: 03/22/1996, 23 y.o.   MRN: [id number]     Virtual Visit via telephone Note  I connected with Samantha Vaughan on 12/27/18 at 10:05AM by telephone and verified that I am speaking with the correct person using two identifiers. Samantha Vaughan is currently located at home and non one is currently with her during visit. The provider, Mary-Margaret Daphine Deutscher, FNP is located in their office at time of visit.  I discussed the limitations, risks, security and privacy concerns of performing an evaluation and management service by telephone and the availability of in person appointments. I also discussed with the patient that there may be a patient responsible charge related to this service. The patient expressed understanding and agreed to proceed.   History and Present Illness:   Chief Complaint: dysuria  HPI Patient calls in c/o dysuria that on Friday. Has to go to the restroom more often and has constant urgency. Denies blood in urine and no back pain.     Review of Systems  Constitutional: Negative.   Respiratory: Negative.   Cardiovascular: Negative.   Genitourinary: Positive for dysuria, frequency and urgency. Negative for hematuria.  Skin: Negative.   Neurological: Negative.   Psychiatric/Behavioral: Negative.   All other systems reviewed and are negative.    Observations/Objective: dysuira  Assessment and Plan: Layan Derflinger  Calls in today with chief complaint of dysuria  1. Acute cystitis without hematuria Take medication as prescribe Cotton underwear Take shower not bath Cranberry juice, yogurt Force fluids AZO over the counter X2 days RTO prn  - nitrofurantoin, macrocrystal-monohydrate, (MACROBID) 100 MG capsule; Take 1 capsule (100 mg total) by mouth 2 (two) times daily. 1 po BId  Dispense: 14 capsule; Refill: 0   Follow Up Instructions: prn    I discussed the assessment and treatment plan with the patient. The patient was  provided an opportunity to ask questions and all were answered. The patient agreed with the plan and demonstrated an understanding of the instructions.   The patient was advised to call back or seek an in-person evaluation if the symptoms worsen or if the condition fails to improve as anticipated.  The above assessment and management plan was discussed with the patient. The patient verbalized understanding of and has agreed to the management plan. Patient is aware to call the clinic if symptoms persist or worsen. Patient is aware when to return to the clinic for a follow-up visit. Patient educated on when it is appropriate to go to the emergency department.    I provided 5 minutes of non-face-to-face time during this encounter.    Mary-Margaret Daphine Deutscher, FNP

## 2019-01-21 ENCOUNTER — Other Ambulatory Visit: Payer: Self-pay

## 2019-01-21 ENCOUNTER — Ambulatory Visit (INDEPENDENT_AMBULATORY_CARE_PROVIDER_SITE_OTHER): Payer: Medicaid Other | Admitting: Family

## 2019-01-21 ENCOUNTER — Encounter: Payer: Self-pay | Admitting: Family

## 2019-01-21 ENCOUNTER — Telehealth: Payer: Self-pay | Admitting: Family

## 2019-01-21 DIAGNOSIS — F17200 Nicotine dependence, unspecified, uncomplicated: Secondary | ICD-10-CM | POA: Diagnosis not present

## 2019-01-21 DIAGNOSIS — H65193 Other acute nonsuppurative otitis media, bilateral: Secondary | ICD-10-CM | POA: Diagnosis not present

## 2019-01-21 MED ORDER — CIPROFLOXACIN-DEXAMETHASONE 0.3-0.1 % OT SUSP: 4.0000 [drp] | Freq: Two times a day (BID) | OTIC | 0 refills | Status: DC

## 2019-01-21 MED ORDER — AMOXICILLIN-POT CLAVULANATE 875-125 MG PO TABS: 1.0000 | ORAL_TABLET | Freq: Two times a day (BID) | ORAL | 0 refills | Status: AC

## 2019-01-21 NOTE — Progress Notes (Signed)
   Virtual Visit via telephone Note  I connected with Samantha Vaughan on 01/21/19 at 4:00 pm by telephone and verified that I am speaking with the correct person using two identifiers. Samantha Vaughan is currently located at driving and no one is currently with her during visit. The provider, Jannifer Rodney, FNP is located in their office at time of visit.  I discussed the limitations, risks, security and privacy concerns of performing an evaluation and management service by telephone and the availability of in person appointments. I also discussed with the patient that there may be a patient responsible charge related to this service. The patient expressed understanding and agreed to proceed.   History and Present Illness:  Otalgia   There is pain in both ears. This is a new problem. The current episode started more than 1 month ago. The problem occurs every few minutes. The problem has been waxing and waning. There has been no fever. The pain is at a severity of 6/10. The pain is moderate. Associated symptoms include ear discharge, hearing loss and rhinorrhea. Pertinent negatives include no coughing, headaches or sore throat. She has tried nothing for the symptoms. The treatment provided no relief.      Review of Systems  HENT: Positive for ear discharge, ear pain, hearing loss and rhinorrhea. Negative for sore throat.   Respiratory: Negative for cough.   Neurological: Negative for headaches.  All other systems reviewed and are negative.    Observations/Objective: No SOB or distress noted  Assessment and Plan: *1. Other acute nonsuppurative otitis media of both ears, recurrence not specified Keep ear clean and dry Do not stick anything into ear Tylenol as needed Call office if symptoms worsen or do not improve - ciprofloxacin-dexamethasone (CIPRODEX) OTIC suspension; Place 4 drops into both ears 2 (two) times daily.  Dispense: 7.5 mL; Refill: 0 - amoxicillin-clavulanate (AUGMENTIN)  875-125 MG tablet; Take 1 tablet by mouth 2 (two) times daily for 10 days.  Dispense: 20 tablet; Refill: 0  2. Current smoker Smoking cessation discussed     I discussed the assessment and treatment plan with the patient. The patient was provided an opportunity to ask questions and all were answered. The patient agreed with the plan and demonstrated an understanding of the instructions.   The patient was advised to call back or seek an in-person evaluation if the symptoms worsen or if the condition fails to improve as anticipated.  The above assessment and management plan was discussed with the patient. The patient verbalized understanding of and has agreed to the management plan. Patient is aware to call the clinic if symptoms persist or worsen. Patient is aware when to return to the clinic for a follow-up visit. Patient educated on when it is appropriate to go to the emergency department.   Time call ended:  4:07pm  I provided 7 minutes of non-face-to-face time during this encounter.    Jannifer Rodney, FNP

## 2019-01-24 MED ORDER — OFLOXACIN 0.3 % OT SOLN: 5.0000 [drp] | Freq: Every day | OTIC | 0 refills | Status: DC

## 2019-01-24 NOTE — Telephone Encounter (Signed)
Ciprodex changed to Ofloxacin Prescription sent to pharmacy

## 2019-04-07 ENCOUNTER — Other Ambulatory Visit: Payer: Self-pay

## 2019-04-07 ENCOUNTER — Ambulatory Visit (INDEPENDENT_AMBULATORY_CARE_PROVIDER_SITE_OTHER): Payer: Medicaid Other | Admitting: Family Medicine

## 2019-04-07 DIAGNOSIS — H60331 Swimmer's ear, right ear: Secondary | ICD-10-CM | POA: Diagnosis not present

## 2019-04-07 MED ORDER — CIPROFLOXACIN-DEXAMETHASONE 0.3-0.1 % OT SUSP: 4.0000 [drp] | Freq: Two times a day (BID) | OTIC | 0 refills | Status: AC

## 2019-04-07 NOTE — Patient Instructions (Signed)

## 2019-04-07 NOTE — Progress Notes (Signed)
   Virtual Visit via Telephone Note  I connected with Samantha Vaughan on 04/07/19 at 8:36 AM by telephone and verified that I am speaking with the correct person using two identifiers. Samantha Vaughan is currently located at [facility] and nobody is currently with her during this visit. The provider, Loman Brooklyn, FNP is located in their home at time of visit.  I discussed the limitations, risks, security and privacy concerns of performing an evaluation and management service by telephone and the availability of in person appointments. I also discussed with the patient that there may be a patient responsible charge related to this service. The patient expressed understanding and agreed to proceed.  Subjective: PCP: [name], FNP  Chief Complaint  Patient presents with  . Ear Pain   Ear Pain: Patient presents with right ear pain that is much worse if she tries to wiggle her ear.  Symptoms include right ear drainage, right ear pain, right ear swelling and the feeling that she had a fever. Symptoms began 3 days ago and are unchanged since that time. She last went swimming 4 days ago. Patient denies dyspnea, eye irritation, nasal congestion, nonproductive cough, productive cough, rhinorrhea, sneezing and sore throat. Ear history: several previous ear infections. Patient was treated for similar symptoms a little over 2 months ago. She has been instilling Benedict since yesterday morning, which does help with the pain for 15-20 minutes.    ROS: Per HPI  Current Outpatient Medications:  .  ciprofloxacin-dexamethasone (CIPRODEX) OTIC suspension, Place 4 drops into the right ear 2 (two) times daily for 7 days., Disp: 7.5 mL, Rfl: 0 .  norgestimate-ethinyl estradiol (SPRINTEC 28) 0.25-35 MG-MCG tablet, Take 1 tablet by mouth daily., Disp: 1 Package, Rfl: 11  No Known Allergies No past medical history on file.  Observations/Objective: A&O  No respiratory distress or wheezing  audible over the phone Mood, judgement, and thought processes all WNL  Assessment and Plan: 1. Acute swimmer's ear of right side - Education provided on otitis externa. Encouraged to use Ibuprofen/Tylenol Q6H PRN for pain.  - ciprofloxacin-dexamethasone (CIPRODEX) OTIC suspension; Place 4 drops into the right ear 2 (two) times daily for 7 days.  Dispense: 7.5 mL; Refill: 0   Follow Up Instructions:  I discussed the assessment and treatment plan with the patient. The patient was provided an opportunity to ask questions and all were answered. The patient agreed with the plan and demonstrated an understanding of the instructions.   The patient was advised to call back or seek an in-person evaluation if the symptoms worsen or if the condition fails to improve as anticipated.  The above assessment and management plan was discussed with the patient. The patient verbalized understanding of and has agreed to the management plan. Patient is aware to call the clinic if symptoms persist or worsen. Patient is aware when to return to the clinic for a follow-up visit. Patient educated on when it is appropriate to go to the emergency department.   Time call ended: 8:44 AM  I provided 8 minutes of non-face-to-face time during this encounter.  Samantha Limes, MSN, APRN, FNP-C [facility] 04/07/19

## 2019-06-07 ENCOUNTER — Other Ambulatory Visit: Payer: Self-pay | Admitting: Family

## 2019-07-25 ENCOUNTER — Other Ambulatory Visit: Payer: Self-pay | Admitting: Family

## 2019-08-17 ENCOUNTER — Other Ambulatory Visit: Payer: Self-pay | Admitting: Family

## 2019-08-17 DIAGNOSIS — Z3009 Encounter for other general counseling and advice on contraception: Secondary | ICD-10-CM

## 2019-08-17 MED ORDER — NORGESTIMATE-ETH ESTRADIOL 0.25-35 MG-MCG PO TABS: ORAL_TABLET | ORAL | 0 refills | Status: DC

## 2019-08-17 NOTE — Addendum Note (Signed)
Addended by: Antonietta Barcelona D on: 08/17/2019 02:59 PM   Modules accepted: Orders

## 2019-09-28 ENCOUNTER — Other Ambulatory Visit: Payer: Self-pay | Admitting: Family

## 2019-09-28 DIAGNOSIS — Z3009 Encounter for other general counseling and advice on contraception: Secondary | ICD-10-CM

## 2019-10-10 ENCOUNTER — Other Ambulatory Visit: Payer: Self-pay | Admitting: Family

## 2019-12-15 DIAGNOSIS — R112 Nausea with vomiting, unspecified: Secondary | ICD-10-CM | POA: Diagnosis not present

## 2019-12-22 ENCOUNTER — Telehealth (INDEPENDENT_AMBULATORY_CARE_PROVIDER_SITE_OTHER): Payer: Medicaid Other | Admitting: Family Medicine

## 2019-12-22 ENCOUNTER — Encounter: Payer: Self-pay | Admitting: Family Medicine

## 2019-12-22 DIAGNOSIS — K219 Gastro-esophageal reflux disease without esophagitis: Secondary | ICD-10-CM

## 2019-12-22 MED ORDER — FAMOTIDINE 20 MG PO TABS: 20.0000 mg | ORAL_TABLET | Freq: Two times a day (BID) | ORAL | 2 refills | Status: DC

## 2019-12-22 NOTE — Progress Notes (Signed)
Virtual Visit via Video note  I connected with Samantha Vaughan on 12/22/19 at 5:47 PM by video and verified that I am speaking with the correct person using two identifiers. Samantha Vaughan is currently located at home and nobody is currently with her during visit. The provider, Gwenlyn Fudge, FNP is located in their home at time of visit.  I discussed the limitations, risks, security and privacy concerns of performing an evaluation and management service by video and the availability of in person appointments. I also discussed with the patient that there may be a patient responsible charge related to this service. The patient expressed understanding and agreed to proceed.  Subjective: PCP: [name], FNP  Chief Complaint  Patient presents with  . Emesis   Patient reports symptoms of nausea, indigestion, "anxiety in the chest", fatigue, and vomiting.  She reports she has not vomited since last week. She was seen at urgent care last week where she was given phenergan. She did take an at home pregnancy test, which was negative. She has also been taking acid chews and pepto bismol which has not been very helpful.    ROS: Per HPI  Current Outpatient Medications:  .  norgestimate-ethinyl estradiol (ESTARYLLA) 0.25-35 MG-MCG tablet, Take 1 tablet by mouth daily. (Needs to be seen before next refill), Disp: 1 Package, Rfl: 0  No Known Allergies History reviewed. No pertinent past medical history.  Observations/Objective: Physical Exam Constitutional:      General: She is not in acute distress.    Appearance: Normal appearance. She is not ill-appearing or toxic-appearing.  Eyes:     General: No scleral icterus.       Right eye: No discharge.        Left eye: No discharge.     Conjunctiva/sclera: Conjunctivae normal.  Pulmonary:     Effort: Pulmonary effort is normal. No respiratory distress.  Neurological:     Mental Status: She is alert and oriented to person, place, and time.    Psychiatric:        Mood and Affect: Mood normal.        Behavior: Behavior normal.        Thought Content: Thought content normal.        Judgment: Judgment normal.    Assessment and Plan: 1. Gastroesophageal reflux disease, unspecified whether esophagitis present - Encouraged to try famotidine for acid reflux to see if this resolves symptoms.  - famotidine (PEPCID) 20 MG tablet; Take 1 tablet (20 mg total) by mouth 2 (two) times daily.  Dispense: 60 tablet; Refill: 2   Follow Up Instructions:   I discussed the assessment and treatment plan with the patient. The patient was provided an opportunity to ask questions and all were answered. The patient agreed with the plan and demonstrated an understanding of the instructions.   The patient was advised to call back or seek an in-person evaluation if the symptoms worsen or if the condition fails to improve as anticipated.  The above assessment and management plan was discussed with the patient. The patient verbalized understanding of and has agreed to the management plan. Patient is aware to call the clinic if symptoms persist or worsen. Patient is aware when to return to the clinic for a follow-up visit. Patient educated on when it is appropriate to go to the emergency department.   Time call ended: 5:54 PM  I provided 8 minutes of face-to-face time during this encounter.   Deliah Boston, MSN, APRN,  FNP-C [facility] 12/22/19

## 2019-12-23 DIAGNOSIS — R112 Nausea with vomiting, unspecified: Secondary | ICD-10-CM | POA: Diagnosis not present

## 2019-12-24 DIAGNOSIS — R1013 Epigastric pain: Secondary | ICD-10-CM | POA: Diagnosis not present

## 2019-12-24 DIAGNOSIS — R110 Nausea: Secondary | ICD-10-CM | POA: Diagnosis not present

## 2019-12-25 DIAGNOSIS — R1110 Vomiting, unspecified: Secondary | ICD-10-CM | POA: Diagnosis not present

## 2019-12-25 DIAGNOSIS — R1013 Epigastric pain: Secondary | ICD-10-CM | POA: Diagnosis not present

## 2019-12-27 ENCOUNTER — Telehealth: Payer: Medicaid Other | Admitting: Family

## 2019-12-30 ENCOUNTER — Encounter: Payer: Self-pay | Admitting: Family Medicine

## 2019-12-30 ENCOUNTER — Other Ambulatory Visit: Payer: Self-pay

## 2019-12-30 ENCOUNTER — Ambulatory Visit (INDEPENDENT_AMBULATORY_CARE_PROVIDER_SITE_OTHER): Payer: Medicaid Other | Admitting: Family Medicine

## 2019-12-30 DIAGNOSIS — R3989 Other symptoms and signs involving the genitourinary system: Secondary | ICD-10-CM

## 2019-12-30 MED ORDER — NITROFURANTOIN MONOHYD MACRO 100 MG PO CAPS: 100.0000 mg | ORAL_CAPSULE | Freq: Two times a day (BID) | ORAL | 0 refills | Status: AC

## 2019-12-30 NOTE — Progress Notes (Signed)
   Virtual Visit via Telephone Note  I connected with [name] on 12/30/19 at 4:23 PM by telephone and verified that I am speaking with the correct person using two identifiers. Samantha Vaughan is currently located at work and nobody is currently with her during this visit. The provider, Gwenlyn Fudge, FNP is located in their home at time of visit.  I discussed the limitations, risks, security and privacy concerns of performing an evaluation and management service by telephone and the availability of in person appointments. I also discussed with the patient that there may be a patient responsible charge related to this service. The patient expressed understanding and agreed to proceed.  Subjective: PCP: [name], FNP  Chief Complaint  Patient presents with  . Urinary Tract Infection   Urinary Tract Infection: Patient complains of abnormal smelling urine and dysuria She has had symptoms for 2 days. Patient also complains of back pain. Patient denies fever. Patient does have a history of recurrent UTI.  Patient does not have a history of pyelonephritis. She has been taking AZO.    ROS: Per HPI  Current Outpatient Medications:  .  famotidine (PEPCID) 20 MG tablet, Take 1 tablet (20 mg total) by mouth 2 (two) times daily., Disp: 60 tablet, Rfl: 2 .  nitrofurantoin, macrocrystal-monohydrate, (MACROBID) 100 MG capsule, Take 1 capsule (100 mg total) by mouth 2 (two) times daily for 5 days. 1 po BId, Disp: 10 capsule, Rfl: 0 .  norgestimate-ethinyl estradiol (ESTARYLLA) 0.25-35 MG-MCG tablet, Take 1 tablet by mouth daily. (Needs to be seen before next refill), Disp: 1 Package, Rfl: 0  No Known Allergies History reviewed. No pertinent past medical history.  Observations/Objective: A&O  No respiratory distress or wheezing audible over the phone Mood, judgement, and thought processes all WNL  Assessment and Plan: 1. Suspected UTI - Education provided on UTIs. Encouraged adequate  hydration.  - nitrofurantoin, macrocrystal-monohydrate, (MACROBID) 100 MG capsule; Take 1 capsule (100 mg total) by mouth 2 (two) times daily for 5 days. 1 po BId  Dispense: 10 capsule; Refill: 0   Follow Up Instructions:  I discussed the assessment and treatment plan with the patient. The patient was provided an opportunity to ask questions and all were answered. The patient agreed with the plan and demonstrated an understanding of the instructions.   The patient was advised to call back or seek an in-person evaluation if the symptoms worsen or if the condition fails to improve as anticipated.  The above assessment and management plan was discussed with the patient. The patient verbalized understanding of and has agreed to the management plan. Patient is aware to call the clinic if symptoms persist or worsen. Patient is aware when to return to the clinic for a follow-up visit. Patient educated on when it is appropriate to go to the emergency department.   Time call ended: 4:30 PM  I provided 9 minutes of non-face-to-face time during this encounter.  Deliah Boston, MSN, APRN, FNP-C [facility] 12/30/19

## 2019-12-30 NOTE — Patient Instructions (Signed)
Urinary Tract Infection, Adult A urinary tract infection (UTI) is an infection of any part of the urinary tract. The urinary tract includes:  The kidneys.  The ureters.  The bladder.  The urethra. These organs make, store, and get rid of pee (urine) in the body. What are the causes? This is caused by germs (bacteria) in your genital area. These germs grow and cause swelling (inflammation) of your urinary tract. What increases the risk? You are more likely to develop this condition if:  You have a small, thin tube (catheter) to drain pee.  You cannot control when you pee or poop (incontinence).  You are female, and: ? You use these methods to prevent pregnancy:  A medicine that kills sperm (spermicide).  A device that blocks sperm (diaphragm). ? You have low levels of a female hormone (estrogen). ? You are pregnant.  You have genes that add to your risk.  You are sexually active.  You take antibiotic medicines.  You have trouble peeing because of: ? A prostate that is bigger than normal, if you are female. ? A blockage in the part of your body that drains pee from the bladder (urethra). ? A kidney stone. ? A nerve condition that affects your bladder (neurogenic bladder). ? Not getting enough to drink. ? Not peeing often enough.  You have other conditions, such as: ? Diabetes. ? A weak disease-fighting system (immune system). ? Sickle cell disease. ? Gout. ? Injury of the spine. What are the signs or symptoms? Symptoms of this condition include:  Needing to pee right away (urgently).  Peeing often.  Peeing small amounts often.  Pain or burning when peeing.  Blood in the pee.  Pee that smells bad or not like normal.  Trouble peeing.  Pee that is cloudy.  Fluid coming from the vagina, if you are female.  Pain in the belly or lower back. Other symptoms include:  Throwing up (vomiting).  No urge to eat.  Feeling mixed up (confused).  Being tired  and grouchy (irritable).  A fever.  Watery poop (diarrhea). How is this treated? This condition may be treated with:  Antibiotic medicine.  Other medicines.  Drinking enough water. Follow these instructions at home:  Medicines  Take over-the-counter and prescription medicines only as told by your doctor.  If you were prescribed an antibiotic medicine, take it as told by your doctor. Do not stop taking it even if you start to feel better. General instructions  Make sure you: ? Pee until your bladder is empty. ? Do not hold pee for a long time. ? Empty your bladder after sex. ? Wipe from front to back after pooping if you are a female. Use each tissue one time when you wipe.  Drink enough fluid to keep your pee pale yellow.  Keep all follow-up visits as told by your doctor. This is important. Contact a doctor if:  You do not get better after 1-2 days.  Your symptoms go away and then come back. Get help right away if:  You have very bad back pain.  You have very bad pain in your lower belly.  You have a fever.  You are sick to your stomach (nauseous).  You are throwing up. Summary  A urinary tract infection (UTI) is an infection of any part of the urinary tract.  This condition is caused by germs in your genital area.  There are many risk factors for a UTI. These include having a small, thin   tube to drain pee and not being able to control when you pee or poop.  Treatment includes antibiotic medicines for germs.  Drink enough fluid to keep your pee pale yellow. This information is not intended to replace advice given to you by your health care provider. Make sure you discuss any questions you have with your health care provider. Document Revised: 08/19/2018 Document Reviewed: 03/11/2018 Elsevier Patient Education  2020 [employer]  

## 2020-01-02 NOTE — Progress Notes (Signed)
Called and requested - will fax to main fax up front - jhb  

## 2020-02-10 ENCOUNTER — Telehealth: Payer: Self-pay | Admitting: Family

## 2020-02-12 DIAGNOSIS — H5213 Myopia, bilateral: Secondary | ICD-10-CM | POA: Diagnosis not present

## 2020-02-14 NOTE — Telephone Encounter (Signed)
CALLED PATIENT, NO ANSWER, LEFT MESSAGE TO RETURN CALL 

## 2020-02-20 NOTE — Telephone Encounter (Signed)
Pt has an appt scheduled with Christy on 02/24/2020.

## 2020-02-24 ENCOUNTER — Encounter: Payer: Self-pay | Admitting: Family

## 2020-02-24 ENCOUNTER — Ambulatory Visit (INDEPENDENT_AMBULATORY_CARE_PROVIDER_SITE_OTHER): Payer: Medicaid Other | Admitting: Family

## 2020-02-24 ENCOUNTER — Other Ambulatory Visit: Payer: Self-pay

## 2020-02-24 VITALS — BP 98/67 | HR 67 | Temp 97.9°F | Ht 61.5 in | Wt 156.8 lb

## 2020-02-24 DIAGNOSIS — R1031 Right lower quadrant pain: Secondary | ICD-10-CM | POA: Diagnosis not present

## 2020-02-24 DIAGNOSIS — Z09 Encounter for follow-up examination after completed treatment for conditions other than malignant neoplasm: Secondary | ICD-10-CM | POA: Diagnosis not present

## 2020-02-24 DIAGNOSIS — N83202 Unspecified ovarian cyst, left side: Secondary | ICD-10-CM

## 2020-02-24 DIAGNOSIS — N83201 Unspecified ovarian cyst, right side: Secondary | ICD-10-CM | POA: Diagnosis not present

## 2020-02-24 LAB — CBC WITH DIFFERENTIAL/PLATELET
Basophils Absolute: 0 10*3/uL (ref 0.0–0.2)
Basos: 1 %
EOS (ABSOLUTE): 0.1 10*3/uL (ref 0.0–0.4)
Eos: 2 %
Hematocrit: 42.4 % (ref 34.0–46.6)
Hemoglobin: 14.2 g/dL (ref 11.1–15.9)
Immature Grans (Abs): 0 10*3/uL (ref 0.0–0.1)
Immature Granulocytes: 0 %
Lymphocytes Absolute: 2.6 10*3/uL (ref 0.7–3.1)
Lymphs: 35 %
MCH: 31.3 pg (ref 26.6–33.0)
MCHC: 33.5 g/dL (ref 31.5–35.7)
MCV: 93 fL (ref 79–97)
Monocytes Absolute: 0.5 10*3/uL (ref 0.1–0.9)
Monocytes: 6 %
Neutrophils Absolute: 4.2 10*3/uL (ref 1.4–7.0)
Neutrophils: 56 %
Platelets: 269 10*3/uL (ref 150–450)
RBC: 4.54 x10E6/uL (ref 3.77–5.28)
RDW: 12.1 % (ref 11.7–15.4)
WBC: 7.5 10*3/uL (ref 3.4–10.8)

## 2020-02-24 NOTE — Patient Instructions (Signed)
Ovarian Cyst     An ovarian cyst is a fluid-filled sac that forms on an ovary. The ovaries are small organs that produce eggs in women. Various types of cysts can form on the ovaries. Some may cause symptoms and require treatment. Most ovarian cysts go away on their own, are not cancerous (are benign), and do not cause problems. Common types of ovarian cysts include:  Functional (follicle) cysts. ? Occur during the menstrual cycle, and usually go away with the next menstrual cycle if you do not get pregnant. ? Usually cause no symptoms.  Endometriomas. ? Are cysts that form from the tissue that lines the uterus (endometrium). ? Are sometimes called "chocolate cysts" because they become filled with blood that turns brown. ? Can cause pain in the lower abdomen during intercourse and during your period.  Cystadenoma cysts. ? Develop from cells on the outside surface of the ovary. ? Can get very large and cause lower abdomen pain and pain with intercourse. ? Can cause severe pain if they twist or break open (rupture).  Dermoid cysts. ? Are sometimes found in both ovaries. ? May contain different kinds of body tissue, such as skin, teeth, hair, or cartilage. ? Usually do not cause symptoms unless they get very big.  Theca lutein cysts. ? Occur when too much of a certain hormone (human chorionic gonadotropin) is produced and overstimulates the ovaries to produce an egg. ? Are most common after having procedures used to assist with the conception of a baby (in vitro fertilization). What are the causes? Ovarian cysts may be caused by:  Ovarian hyperstimulation syndrome. This is a condition that can develop from taking fertility medicines. It causes multiple large ovarian cysts to form.  Polycystic ovarian syndrome (PCOS). This is a common hormonal disorder that can cause ovarian cysts, as well as problems with your period or fertility. What increases the risk? The following factors may  make you more likely to develop ovarian cysts:  Being overweight or obese.  Taking fertility medicines.  Taking certain forms of hormonal birth control.  Smoking. What are the signs or symptoms? Many ovarian cysts do not cause symptoms. If symptoms are present, they may include:  Pelvic pain or pressure.  Pain in the lower abdomen.  Pain during sex.  Abdominal swelling.  Abnormal menstrual periods.  Increasing pain with menstrual periods. How is this diagnosed? These cysts are commonly found during a routine pelvic exam. You may have tests to find out more about the cyst, such as:  Ultrasound.  X-ray of the pelvis.  CT scan.  MRI.  Blood tests. How is this treated? Many ovarian cysts go away on their own without treatment. Your health care provider may want to check your cyst regularly for 2-3 months to see if it changes. If you are in menopause, it is especially important to have your cyst monitored closely because menopausal women have a higher rate of ovarian cancer. When treatment is needed, it may include:  Medicines to help relieve pain.  A procedure to drain the cyst (aspiration).  Surgery to remove the whole cyst.  Hormone treatment or birth control pills. These methods are sometimes used to help dissolve a cyst. Follow these instructions at home:  Take over-the-counter and prescription medicines only as told by your health care provider.  Do not drive or use heavy machinery while taking prescription pain medicine.  Get regular pelvic exams and Pap tests as often as told by your health care provider.    Return to your normal activities as told by your health care provider. Ask your health care provider what activities are safe for you.  Do not use any products that contain nicotine or tobacco, such as cigarettes and e-cigarettes. If you need help quitting, ask your health care provider.  Keep all follow-up visits as told by your health care provider.  This is important. Contact a health care provider if:  Your periods are late, irregular, or painful, or they stop.  You have pelvic pain that does not go away.  You have pressure on your bladder or trouble emptying your bladder completely.  You have pain during sex.  You have any of the following in your abdomen: ? A feeling of fullness. ? Pressure. ? Discomfort. ? Pain that does not go away. ? Swelling.  You feel generally ill.  You become constipated.  You lose your appetite.  You develop severe acne.  You start to have more body hair and facial hair.  You are gaining weight or losing weight without changing your exercise and eating habits.  You think you may be pregnant. Get help right away if:  You have abdominal pain that is severe or gets worse.  You cannot eat or drink without vomiting.  You suddenly develop a fever.  Your menstrual period is much heavier than usual. This information is not intended to replace advice given to you by your health care provider. Make sure you discuss any questions you have with your health care provider. Document Revised: 11/30/2017 Document Reviewed: 02/03/2016 Elsevier Patient Education  2020 Elsevier Inc.  

## 2020-02-24 NOTE — Progress Notes (Signed)
   Subjective:    Patient ID: Samantha Vaughan, female    DOB: September 20, 1995, 24 y.o.   MRN: [id number]  Chief Complaint  Patient presents with  . Cyst    right     HPI Pt presents to the office today for ER follow up. She states she went to the ED on 05/ with nausea and vomiting for two weeks. She had a CT scan that was negative for acute findings, but did find an accidentally right ovarian cyst.   She was COVID negative and lab was stable.   She reports her nausea and vomiting has resolved. She does report mild constant cramping pain of 5-6 out 10 in her right lower abdomen. Denies any vaginal bleeding. She is worried this is from her ovarian cyst. She is taking OC.    Review of Systems  All other systems reviewed and are negative.      Objective:   Physical Exam Vitals reviewed.  Constitutional:      General: She is not in acute distress.    Appearance: She is well-developed.  HENT:     Head: Normocephalic and atraumatic.     Right Ear: Tympanic membrane normal.     Left Ear: Tympanic membrane normal.  Eyes:     Pupils: Pupils are equal, round, and reactive to light.  Neck:     Thyroid: No thyromegaly.  Cardiovascular:     Rate and Rhythm: Normal rate and regular rhythm.     Heart sounds: Normal heart sounds. No murmur heard.   Pulmonary:     Effort: Pulmonary effort is normal. No respiratory distress.     Breath sounds: Normal breath sounds. No wheezing.  Abdominal:     General: Bowel sounds are normal. There is no distension.     Palpations: Abdomen is soft.     Tenderness: There is no abdominal tenderness (no tenderness noted).  Musculoskeletal:        General: No tenderness. Normal range of motion.     Cervical back: Normal range of motion and neck supple.  Skin:    General: Skin is warm and dry.  Neurological:     Mental Status: She is alert and oriented to person, place, and time.     Cranial Nerves: No cranial nerve deficit.     Deep Tendon Reflexes:  Reflexes are normal and symmetric.  Psychiatric:        Behavior: Behavior normal.        Thought Content: Thought content normal.        Judgment: Judgment normal.      BP 98/67   Pulse 67   Temp 97.9 F (36.6 C) (Temporal)   Ht 5' 1.5" (1.562 m)   Wt 156 lb 12.8 oz (71.1 kg)   LMP 02/17/2020   SpO2 97%   BMI 29.15 kg/m       Assessment & Plan:  Samantha Vaughan comes in today with chief complaint of Cyst (right )   Diagnosis and orders addressed:  1. Hospital discharge follow-up - CBC with Differential/Platelet  2. Left ovarian cyst - CBC with Differential/Platelet - US Pelvic Complete With Transvaginal; Future  3. Right lower quadrant abdominal pain - CBC with Differential/Platelet - US Pelvic Complete With Transvaginal; Future     Labs pending Hospital notes reviewed  Education provided on cysts [place] pending, if resolved no other follow up needed. If it has become larger will do referral to GYN.    Jannifer Rodney, FNP

## 2020-03-01 ENCOUNTER — Ambulatory Visit (HOSPITAL_COMMUNITY)
Admission: RE | Admit: 2020-03-01 | Discharge: 2020-03-01 | Disposition: A | Discharge status: Home / Self Care | Payer: Medicaid Other | Source: Ambulatory Visit | Attending: Family | Admitting: Family

## 2020-03-01 ENCOUNTER — Other Ambulatory Visit: Payer: Self-pay

## 2020-03-01 DIAGNOSIS — N83202 Unspecified ovarian cyst, left side: Secondary | ICD-10-CM | POA: Diagnosis not present

## 2020-03-01 DIAGNOSIS — R1031 Right lower quadrant pain: Secondary | ICD-10-CM | POA: Diagnosis not present

## 2020-03-01 DIAGNOSIS — N83201 Unspecified ovarian cyst, right side: Secondary | ICD-10-CM | POA: Diagnosis not present

## 2020-03-20 ENCOUNTER — Other Ambulatory Visit: Payer: Self-pay | Admitting: Family

## 2020-03-28 DIAGNOSIS — E860 Dehydration: Secondary | ICD-10-CM | POA: Diagnosis not present

## 2020-03-28 DIAGNOSIS — K529 Noninfective gastroenteritis and colitis, unspecified: Secondary | ICD-10-CM | POA: Diagnosis not present

## 2020-03-28 DIAGNOSIS — Z3202 Encounter for pregnancy test, result negative: Secondary | ICD-10-CM | POA: Diagnosis not present

## 2020-03-28 DIAGNOSIS — F17210 Nicotine dependence, cigarettes, uncomplicated: Secondary | ICD-10-CM | POA: Diagnosis not present

## 2020-04-26 ENCOUNTER — Other Ambulatory Visit: Payer: Self-pay | Admitting: Family

## 2020-09-06 DIAGNOSIS — O26841 Uterine size-date discrepancy, first trimester: Secondary | ICD-10-CM | POA: Diagnosis not present

## 2020-09-06 DIAGNOSIS — Z3201 Encounter for pregnancy test, result positive: Secondary | ICD-10-CM | POA: Diagnosis not present

## 2020-09-06 DIAGNOSIS — Z3689 Encounter for other specified antenatal screening: Secondary | ICD-10-CM | POA: Diagnosis not present

## 2020-09-06 DIAGNOSIS — O021 Missed abortion: Secondary | ICD-10-CM | POA: Diagnosis not present

## 2020-09-06 DIAGNOSIS — O3680X0 Pregnancy with inconclusive fetal viability, not applicable or unspecified: Secondary | ICD-10-CM | POA: Diagnosis not present

## 2020-09-06 DIAGNOSIS — Z3A10 10 weeks gestation of pregnancy: Secondary | ICD-10-CM | POA: Diagnosis not present

## 2020-09-06 DIAGNOSIS — N912 Amenorrhea, unspecified: Secondary | ICD-10-CM | POA: Diagnosis not present

## 2020-09-18 DIAGNOSIS — O021 Missed abortion: Secondary | ICD-10-CM | POA: Diagnosis not present

## 2020-09-26 DIAGNOSIS — O021 Missed abortion: Secondary | ICD-10-CM | POA: Diagnosis not present

## 2021-01-02 DIAGNOSIS — N3090 Cystitis, unspecified without hematuria: Secondary | ICD-10-CM | POA: Diagnosis not present

## 2021-05-23 ENCOUNTER — Ambulatory Visit (INDEPENDENT_AMBULATORY_CARE_PROVIDER_SITE_OTHER): Payer: Medicaid Other | Admitting: Obstetrics and Gynecology

## 2021-05-23 ENCOUNTER — Other Ambulatory Visit (HOSPITAL_COMMUNITY)
Admission: RE | Admit: 2021-05-23 | Discharge: 2021-05-23 | Disposition: A | Discharge status: Home / Self Care | Payer: Medicaid Other | Source: Ambulatory Visit | Attending: Obstetrics and Gynecology | Admitting: Obstetrics and Gynecology

## 2021-05-23 ENCOUNTER — Other Ambulatory Visit: Payer: Self-pay

## 2021-05-23 VITALS — BP 114/66 | HR 76 | Wt 162.0 lb

## 2021-05-23 DIAGNOSIS — Z3480 Encounter for supervision of other normal pregnancy, unspecified trimester: Secondary | ICD-10-CM | POA: Insufficient documentation

## 2021-05-23 DIAGNOSIS — N912 Amenorrhea, unspecified: Secondary | ICD-10-CM | POA: Diagnosis not present

## 2021-05-23 DIAGNOSIS — Z3481 Encounter for supervision of other normal pregnancy, first trimester: Secondary | ICD-10-CM | POA: Diagnosis not present

## 2021-05-23 DIAGNOSIS — Z3A01 Less than 8 weeks gestation of pregnancy: Secondary | ICD-10-CM

## 2021-05-23 LAB — POCT URINALYSIS DIPSTICK OB
Appearance: NORMAL
Bilirubin, UA: NEGATIVE
Blood, UA: NEGATIVE
Glucose, UA: NEGATIVE
Ketones, UA: NEGATIVE
Leukocytes, UA: NEGATIVE
Nitrite, UA: NEGATIVE
Odor: NORMAL
POC,PROTEIN,UA: NEGATIVE
Spec Grav, UA: 1.02 (ref 1.010–1.025)
Urobilinogen, UA: 0.2 E.U./dL
pH, UA: 6 (ref 5.0–8.0)

## 2021-05-23 LAB — POCT URINE PREGNANCY: Preg Test, Ur: POSITIVE — AB

## 2021-05-23 NOTE — Progress Notes (Signed)
New Obstetric Patient H&P   Chief Complaint: "Desires prenatal care"   History of Present Illness: Patient is a 25 y.o. [id number] Not Hispanic or Latino female, sure LMP 04/09/2021 presents with amenorrhea and positive home pregnancy test. Based on her  LMP, her EDD is Estimated Date of Delivery: 01/14/22 and her EGA is [redacted]w[redacted]d. Cycles are regular, coming monthly, lasting 5-6 days.  Her last pap smear was > 3 years ago. She recalls the pap smear being normal.    She had a urine pregnancy test which was positive 2 week(s)  ago. Since her LMP she claims she has experienced no issues. She denies vaginal bleeding. Her past medical history is noncontributory. Her prior pregnancies are notable for uncomplicated pregnancies, SVDs x 2.   Since her LMP, she admits to the use of tobacco products: was smoking and quit when she found out she was pregnant.  She claims she has gained ~5 pounds since the start of her pregnancy.  There are cats in the home in the home  yes If yes Indoor (fiance changes litter) She admits close contact with children on a regular basis  yes  She has had chicken pox in the past unknown She has had Tuberculosis exposures, symptoms, or previously tested positive for TB   no Current or past history of domestic violence. no  Genetic Screening/Teratology Counseling: (Includes patient, baby's father, or anyone in either family with:)   1. Patient's age >/= 48 at [facility]  no 2. Thalassemia ([place], [place], Mediterranean, or Asian background): MCV<80  no 3. Neural tube defect (meningomyelocele, spina bifida, anencephaly)  no 4. Congenital heart defect  no  5. Down syndrome  no 6. Tay-Sachs (Jewish, [place]  no 7. Canavan's Disease  no 8. Sickle cell disease or trait (African)  no  9. Hemophilia or other blood disorders  no  10. Muscular dystrophy  no  11. Cystic fibrosis  no  12. Huntington's Chorea  no  13. Mental retardation/autism  no 14. Other inherited genetic or  chromosomal disorder  no 15. Maternal metabolic disorder (DM, PKU, etc)  no 16. Patient or FOB with a child with a birth defect not listed above no  16a. Patient or FOB with a birth defect themselves no 17. Recurrent pregnancy loss, or stillbirth  no  18. Any medications since LMP other than prenatal vitamins (include vitamins, supplements, OTC meds, drugs, alcohol)  no 19. Any other genetic/environmental exposure to discuss  no  Infection History:   1. Lives with someone with TB or TB exposed  no  2. Patient or partner has history of genital herpes  no 3. Rash or viral illness since LMP  no 4. History of STI (GC, CT, HPV, syphilis, HIV)  no 5. History of recent travel :  no  Other pertinent information:  no     Review of Systems:10 point review of systems negative unless otherwise noted in HPI  Past Medical History:  Diagnosis Date   No known health problems     Past Surgical History:  Procedure Laterality Date   NO PAST SURGERIES      Gynecologic History: Patient's last menstrual period was 04/09/2021 (exact date).  Obstetric History: [id number]  Family History  Problem Relation Age of Onset   Breast cancer Neg Hx    Ovarian cancer Neg Hx     Social History   Socioeconomic History   Marital status: Single    Spouse name: Not on file   Number of children:  Not on file   Years of education: Not on file   Highest education level: Not on file  Occupational History   Not on file  Tobacco Use   Smoking status: Former    Packs/day: 0.50    Types: Cigarettes   Smokeless tobacco: Never  Vaping Use   Vaping Use: Never used  Substance and Sexual Activity   Alcohol use: Yes    Comment: occ   Drug use: Never   Sexual activity: Yes  Other Topics Concern   Not on file  Social History Narrative   Not on file   Social Determinants of Health   Financial Resource Strain: Not on file  Food Insecurity: Not on file  Transportation Needs: Not on file  Physical  Activity: Not on file  Stress: Not on file  Social Connections: Not on file  Intimate Partner Violence: Not on file    No Known Allergies  Prior to Admission medications   Medication Sig Start Date End Date Taking? Authorizing Provider  Prenatal Vit-Fe Fumarate-FA (MULTIVITAMIN-PRENATAL) 27-0.8 MG TABS tablet Take 1 tablet by mouth daily at 12 noon.   Yes [provider]    Physical Exam BP 114/66   Pulse 76   Wt 162 lb (73.5 kg)   LMP 04/09/2021 (Exact Date)   BMI 30.11 kg/m   Physical Exam Constitutional:      General: She is not in acute distress.    Appearance: Normal appearance. She is well-developed.  Genitourinary:     Vulva, bladder and urethral meatus normal.     Right Labia: No rash, tenderness, lesions, skin changes or Bartholin's cyst.    Left Labia: No tenderness, skin changes, Bartholin's cyst or rash.    No inguinal adenopathy present in the right or left side.    Pelvic Tanner Score: 5/5.     Right Adnexa: not tender, not full and no mass present.    Left Adnexa: not tender, not full and no mass present.    No cervical motion tenderness, friability, lesion or polyp.     Uterus is not enlarged, fixed or tender.     Uterus is anteverted.     No urethral tenderness or mass present.     Pelvic exam was performed with patient in the lithotomy position.  HENT:     Head: Normocephalic and atraumatic.  Eyes:     General: No scleral icterus.    Conjunctiva/sclera: Conjunctivae normal.  Cardiovascular:     Rate and Rhythm: Normal rate and regular rhythm.     Heart sounds: No murmur heard.   No friction rub. No gallop.  Pulmonary:     Effort: Pulmonary effort is normal. No respiratory distress.     Breath sounds: Normal breath sounds. No wheezing or rales.  Abdominal:     General: Bowel sounds are normal. There is no distension.     Palpations: Abdomen is soft. There is no mass.     Tenderness: There is no abdominal tenderness. There is no guarding  or rebound.     Hernia: There is no hernia in the left inguinal area or right inguinal area.  Musculoskeletal:        General: Normal range of motion.     Cervical back: Normal range of motion and neck supple.  Lymphadenopathy:     Lower Body: No right inguinal adenopathy. No left inguinal adenopathy.  Neurological:     General: No focal deficit present.     Mental Status: She  is alert and oriented to person, place, and time.     Cranial Nerves: No cranial nerve deficit.  Skin:    General: Skin is warm and dry.     Findings: No erythema.  Psychiatric:        Mood and Affect: Mood normal.        Behavior: Behavior normal.        Judgment: Judgment normal.     Female Chaperone present during breast and/or pelvic exam.   Assessment: 25 y.o. [id number] at [redacted]w[redacted]d presenting to initiate prenatal care  Plan: 1) Avoid alcoholic beverages. 2) Patient encouraged not to smoke.  3) Discontinue the use of all non-medicinal drugs and chemicals.  4) Take prenatal vitamins daily.  5) Nutrition, food safety (fish, cheese advisories, and high nitrite foods) and exercise discussed. 6) Hospital and practice style discussed with cross coverage system.  7) Genetic Screening, such as with 1st Trimester Screening, cell free fetal DNA, AFP testing, and Ultrasound, as well as with amniocentesis and CVS as appropriate, is discussed with patient. At the conclusion of today's visit patient undecided genetic testing 8) Patient is asked about travel to areas at risk for the [place] virus, and counseled to avoid travel and exposure to mosquitoes or sexual partners who may have themselves been exposed to the virus. Testing is discussed, and will be ordered as appropriate.  9) Schedule dating ultrasound when possible 10) NOB labs today  Thomasene Mohair, MD 05/23/2021 2:56 PM

## 2021-05-24 LAB — RPR+RH+ABO+RUB AB+AB SCR+CB...
Antibody Screen: NEGATIVE
HIV Screen 4th Generation wRfx: NONREACTIVE
Hematocrit: 44.7 % (ref 34.0–46.6)
Hemoglobin: 15.2 g/dL (ref 11.1–15.9)
Hepatitis B Surface Ag: NEGATIVE
MCH: 31.3 pg (ref 26.6–33.0)
MCHC: 34 g/dL (ref 31.5–35.7)
MCV: 92 fL (ref 79–97)
Platelets: 265 10*3/uL (ref 150–450)
RBC: 4.85 x10E6/uL (ref 3.77–5.28)
RDW: 11.8 % (ref 11.7–15.4)
RPR Ser Ql: NONREACTIVE
Rh Factor: POSITIVE
Rubella Antibodies, IGG: 1.46 index (ref >0.99)
Varicella zoster IgG: <135 index — ABNORMAL LOW (ref >165)
WBC: 8.1 10*3/uL (ref 3.4–10.8)

## 2021-05-25 LAB — URINE CULTURE

## 2021-05-29 ENCOUNTER — Telehealth: Payer: Self-pay

## 2021-05-29 NOTE — Telephone Encounter (Signed)
Pt calling; has appt tomorrow at 3:10pm; has been spotting for 3d; it's dark; has also been cramping on the right side of lower abd.  [phone]  Pt denies IC 24-48 hrs before this started; adv it could be from her exam on the 8th; okay to wait for appt tomorrow.  Pt states she has always cramped on the right side even before preg so that's nothing new.  Pt reassured.

## 2021-05-30 ENCOUNTER — Ambulatory Visit (INDEPENDENT_AMBULATORY_CARE_PROVIDER_SITE_OTHER): Payer: Medicaid Other | Admitting: Obstetrics and Gynecology

## 2021-05-30 ENCOUNTER — Other Ambulatory Visit: Payer: Self-pay

## 2021-05-30 VITALS — BP 138/73 | Wt 164.0 lb

## 2021-05-30 DIAGNOSIS — Z3480 Encounter for supervision of other normal pregnancy, unspecified trimester: Secondary | ICD-10-CM | POA: Diagnosis not present

## 2021-05-30 DIAGNOSIS — O3680X0 Pregnancy with inconclusive fetal viability, not applicable or unspecified: Secondary | ICD-10-CM | POA: Diagnosis not present

## 2021-05-30 DIAGNOSIS — O209 Hemorrhage in early pregnancy, unspecified: Secondary | ICD-10-CM | POA: Diagnosis not present

## 2021-05-30 LAB — POCT URINALYSIS DIPSTICK OB
Glucose, UA: NEGATIVE
POC,PROTEIN,UA: NEGATIVE

## 2021-05-30 NOTE — Progress Notes (Signed)
ROB - dating scan, spotting and Rt side pain x4 days. Procedure RM

## 2021-05-31 LAB — CYTOLOGY - PAP
Chlamydia: NEGATIVE
Comment: NEGATIVE
Comment: NORMAL
Diagnosis: NEGATIVE
Neisseria Gonorrhea: NEGATIVE

## 2021-05-31 LAB — BETA HCG QUANT (REF LAB): hCG Quant: 4303 m[IU]/mL

## 2021-05-31 LAB — PROGESTERONE: Progesterone: 8.2 ng/mL

## 2021-06-03 ENCOUNTER — Other Ambulatory Visit: Payer: Self-pay

## 2021-06-03 ENCOUNTER — Other Ambulatory Visit: Payer: Medicaid Other

## 2021-06-03 DIAGNOSIS — Z3480 Encounter for supervision of other normal pregnancy, unspecified trimester: Secondary | ICD-10-CM

## 2021-06-03 DIAGNOSIS — O209 Hemorrhage in early pregnancy, unspecified: Secondary | ICD-10-CM

## 2021-06-03 DIAGNOSIS — O3680X0 Pregnancy with inconclusive fetal viability, not applicable or unspecified: Secondary | ICD-10-CM

## 2021-06-04 LAB — BETA HCG QUANT (REF LAB): hCG Quant: 5686 m[IU]/mL

## 2021-06-05 ENCOUNTER — Telehealth: Payer: Self-pay

## 2021-06-05 NOTE — Telephone Encounter (Signed)
Samantha Vaughan, you can relay the numbers to her, but the interpretation will need to come from Dr Bonney Aid (tomorrow).  I do not have notes or [place] info to interpret these numbers accurately alone.

## 2021-06-05 NOTE — Telephone Encounter (Signed)
Pt calling; is unable to login to MyChart for blood work results from Monday.  [phone]

## 2021-06-06 ENCOUNTER — Other Ambulatory Visit: Payer: Self-pay

## 2021-06-06 ENCOUNTER — Emergency Department
Admission: EM | Admit: 2021-06-06 | Discharge: 2021-06-06 | Disposition: A | Discharge status: Home / Self Care | Payer: Medicaid Other | Attending: Emergency Medicine | Admitting: Emergency Medicine

## 2021-06-06 ENCOUNTER — Emergency Department: Payer: Medicaid Other

## 2021-06-06 DIAGNOSIS — Z5321 Procedure and treatment not carried out due to patient leaving prior to being seen by health care provider: Secondary | ICD-10-CM | POA: Diagnosis not present

## 2021-06-06 DIAGNOSIS — Z3A01 Less than 8 weeks gestation of pregnancy: Secondary | ICD-10-CM | POA: Insufficient documentation

## 2021-06-06 DIAGNOSIS — O209 Hemorrhage in early pregnancy, unspecified: Secondary | ICD-10-CM

## 2021-06-06 DIAGNOSIS — O26851 Spotting complicating pregnancy, first trimester: Secondary | ICD-10-CM | POA: Diagnosis not present

## 2021-06-06 LAB — CBC
HCT: 40.1 % (ref 36.0–46.0)
Hemoglobin: 14.9 g/dL (ref 12.0–15.0)
MCH: 32.7 pg (ref 26.0–34.0)
MCHC: 37.2 g/dL — ABNORMAL HIGH (ref 30.0–36.0)
MCV: 87.9 fL (ref 80.0–100.0)
Platelets: 260 10*3/uL (ref 150–400)
RBC: 4.56 MIL/uL (ref 3.87–5.11)
RDW: 11 % — ABNORMAL LOW (ref 11.5–15.5)
WBC: 7 10*3/uL (ref 4.0–10.5)
nRBC: 0 % (ref 0.0–0.2)

## 2021-06-06 LAB — HCG, QUANTITATIVE, PREGNANCY: hCG, Beta Chain, Quant, S: 9902 m[IU]/mL — ABNORMAL HIGH (ref <5)

## 2021-06-06 LAB — POC URINE PREG, ED: Preg Test, Ur: POSITIVE — AB

## 2021-06-06 NOTE — ED Triage Notes (Signed)
Pt states she is about [redacted] weeks pregnant and has been spotting for the past 2 weeks with some mild abd cramping.

## 2021-06-06 NOTE — Telephone Encounter (Signed)
Pt called; spotting is heavier and brighter c clots; what to do?  [phone]  Pt states she is not saturating a pad in 77min-1hr; she has had to change from a panty liner to a regular pad.  Adv pt to go to the ED as we have no availability.

## 2021-06-07 ENCOUNTER — Telehealth: Payer: Self-pay

## 2021-06-07 NOTE — Telephone Encounter (Signed)
Transition Care Management Follow-up Telephone Call Date of discharge and from where: 06/06/2021-[facility] How have you been since you were released from the hospital? Patient stated she is doing ok.  Any questions or concerns? No  Items Reviewed: Did the pt receive and understand the discharge instructions provided? Yes  Medications obtained and verified?  No medications sent at discharge  Other? No  Any new allergies since your discharge? No  Dietary orders reviewed? No Do you have support at home? Yes   Home Care and Equipment/Supplies: Were home health services ordered? not applicable If so, what is the name of the agency? N/A  Has the agency set up a time to come to the patient's home? not applicable Were any new equipment or medical supplies ordered?  No What is the name of the medical supply agency? N/A Were you able to get the supplies/equipment? not applicable Do you have any questions related to the use of the equipment or supplies? No  Functional Questionnaire: (I = Independent and D = Dependent) ADLs: I  Bathing/Dressing- I  Meal Prep- I  Eating- I  Maintaining continence- I  Transferring/Ambulation- I  Managing Meds- I  Follow up appointments reviewed:  PCP Hospital f/u appt confirmed? No   Specialist Hospital f/u appt confirmed? Yes  Scheduled to see Dr. Geanie Cooley on 06/11/2021 @ 9:50am. Are transportation arrangements needed? No  If their condition worsens, is the pt aware to call PCP or go to the Emergency Dept.? Yes Was the patient provided with contact information for the PCP's office or ED? Yes Was to pt encouraged to call back with questions or concerns? Yes

## 2021-06-08 NOTE — Progress Notes (Signed)
Routine Prenatal Care Visit  Subjective  [name] is a 25 y.o. [phone] at [redacted]w[redacted]d being seen today for ongoing prenatal care.  She is currently monitored for the following issues for this low-risk pregnancy and has Current smoker; Obesity (BMI 30-39.9); and Supervision of other normal pregnancy, antepartum on their problem list.  ----------------------------------------------------------------------------------- Patient reports no complaints.  Did have some light spotting, no cramping.  Rh positive Contractions: Not present. Vag. Bleeding: Scant.  Movement: Absent. Denies leaking of fluid.  ----------------------------------------------------------------------------------- The following portions of the patient's history were reviewed and updated as appropriate: allergies, current medications, past family history, past medical history, past social history, past surgical history and problem list. Problem list updated.   Objective  Blood pressure 138/73, weight 164 lb (74.4 kg), last menstrual period 04/09/2021, unknown if currently breastfeeding. Pregravid weight 155 lb (70.3 kg) Total Weight Gain 9 lb (4.082 kg) Urinalysis:      Fetal Status:     Movement: Absent     General:  Alert, oriented and cooperative. Patient is in no acute distress.  Skin: Skin is warm and dry. No rash noted.   Cardiovascular: Normal heart rate noted  Respiratory: Normal respiratory effort, no problems with respiration noted  Abdomen: Soft, gravid, appropriate for gestational age. Pain/Pressure: Absent     Pelvic:  Cervical exam deferred        Extremities: Normal range of motion.     ental Status: Normal mood and affect. Normal behavior. Normal judgment and thought content.   Besides ultrasound: Beside ultrasound today reveals and early intrauterine gestational sac, no evidence of adnxeal masses.Early yolk sac is visualized  Assessment   25 y.o. [id number] at [redacted]w[redacted]d by  01/14/2022, by Last Menstrual Period  presenting for routine prenatal visit  Plan   Pregnancy #4 Problems (from 05/23/21 to present)     Problem Noted Resolved   Supervision of other normal pregnancy, antepartum 05/23/2021 by Conard Novak, MD No   Overview Signed 05/23/2021  5:39 PM by Conard Novak, MD     Nursing Staff Provider  Office Location  [facility]    Language  English Anatomy US    Flu Vaccine   Genetic Screen  NIPS:   TDaP vaccine    Hgb A1C or  GTT Early : Third trimester :   Covid    LAB RESULTS   Rhogam   Blood Type     Feeding Plan  Antibody    Contraception  Rubella    Circumcision  RPR     Pediatrician   HBsAg     Support Person  HIV    Prenatal Classes  Varicella     GBS  (For PCN allergy, check sensitivities)   BTL Consent     VBAC Consent  Pap      Hgb Electro    Pelvis Tested  CF      SMA                    Gestational age appropriate obstetric precautions including but not limited to vaginal bleeding, contractions, leaking of fluid and fetal movement were reviewed in detail with the patient.    Dating scan - pregnancy uncertain viability. Will trend HCG levels.  Based on findings follow up ultrasound is recommended in 11 days.  "Society of Radiologyst in Ultrasound Guidelines for Transvaginal Ultrasonographic Diagnosis of Early Pregnancy Loss" and adopted in ACOG Practice Bulletin Number 150, May 2015 (reaffirmed 2017) "Early Pregnancy Loss"  Return in about 11 days (around 06/10/2021) for ROB and dating scan MD 11 days, repeat labs  on 9/19.  Vena Austria, MD, [facility], [facility]

## 2021-06-11 ENCOUNTER — Ambulatory Visit (INDEPENDENT_AMBULATORY_CARE_PROVIDER_SITE_OTHER): Payer: Medicaid Other | Admitting: Obstetrics and Gynecology

## 2021-06-11 ENCOUNTER — Other Ambulatory Visit: Payer: Self-pay

## 2021-06-11 VITALS — BP 126/72 | Wt 166.0 lb

## 2021-06-11 DIAGNOSIS — Z3480 Encounter for supervision of other normal pregnancy, unspecified trimester: Secondary | ICD-10-CM

## 2021-06-11 DIAGNOSIS — O039 Complete or unspecified spontaneous abortion without complication: Secondary | ICD-10-CM

## 2021-06-11 LAB — POCT URINALYSIS DIPSTICK OB
Glucose, UA: NEGATIVE
POC,PROTEIN,UA: NEGATIVE

## 2021-06-11 NOTE — Progress Notes (Signed)
Obstetric Problem Visit    Chief Complaint:  Chief Complaint  Patient presents with   Routine Prenatal Visit    History of Present Illness: Patient is a 25 y.o. [id number] [redacted]w[redacted]d presenting for first trimester bleeding.  She has had off an on bleeding throughout the first trimester but has noted an increase over the weekend.  Is bleeding equal to or greater than normal menstrual flow:  Yes Any recent trauma:  No Recent intercourse:  No Prior ultrasound demonstrating IUP: Yes Prior ultrasound demonstrating viable IUP:  No Prior Serum HCG:   05/30/21 4379mIU/mL 06/03/21 5648mIU/mL 06/06/21 9927mIU/mL Rh status: A  positive  Review of Systems: Review of Systems  Constitutional: Negative.   Gastrointestinal: Negative.   Genitourinary: Negative.    Past Medical History:  Patient Active Problem List   Diagnosis Date Noted   Supervision of other normal pregnancy, antepartum 05/23/2021     Nursing Staff Provider  Office Location  [facility] Dating    Language  English Anatomy US    Flu Vaccine   Genetic Screen  NIPS:   TDaP vaccine    Hgb A1C or  GTT Early : Third trimester :   Covid    LAB RESULTS   Rhogam   Blood Type     Feeding Plan  Antibody    Contraception  Rubella    Circumcision  RPR     Pediatrician   HBsAg     Support Person  HIV    Prenatal Classes  Varicella     GBS  (For PCN allergy, check sensitivities)   BTL Consent     VBAC Consent  Pap      Hgb Electro    Pelvis Tested  CF      SMA             Current smoker 06/04/2018   Obesity (BMI 30-39.9) 06/04/2018    Past Surgical History:  Past Surgical History:  Procedure Laterality Date   NO PAST SURGERIES      Obstetric History: [id number]  Family History:  Family History  Problem Relation Age of Onset   Breast cancer Neg Hx    Ovarian cancer Neg Hx     Social History:  Social History   Socioeconomic History   Marital status: Single    Spouse name: Not on file   Number of children: Not on file    Years of education: Not on file   Highest education level: Not on file  Occupational History   Not on file  Tobacco Use   Smoking status: Former    Packs/day: 0.50    Types: Cigarettes   Smokeless tobacco: Never  Vaping Use   Vaping Use: Never used  Substance and Sexual Activity   Alcohol use: Not Currently    Comment: occ   Drug use: Never   Sexual activity: Yes  Other Topics Concern   Not on file  Social History Narrative   Not on file   Social Determinants of Health   Financial Resource Strain: Not on file  Food Insecurity: Not on file  Transportation Needs: Not on file  Physical Activity: Not on file  Stress: Not on file  Social Connections: Not on file  Intimate Partner Violence: Not on file    Allergies:  No Known Allergies  Medications: Prior to Admission medications   Medication Sig Start Date End Date Taking? Authorizing Provider  Prenatal Vit-Fe Fumarate-FA (MULTIVITAMIN-PRENATAL) 27-0.8 MG TABS tablet Take 1 tablet by mouth  daily at 12 noon.   Yes [provider]  famotidine (PEPCID) 20 MG tablet Take 1 tablet (20 mg total) by mouth 2 (two) times daily. Patient not taking: No sig reported 12/22/19   Gwenlyn Fudge, FNP    Physical Exam Vitals: Blood pressure 126/72, weight 166 lb (75.3 kg), last menstrual period 04/09/2021, unknown if currently breastfeeding. General: NAD HEENT: normocephalic, anicteric Pulmonary: No increased work of breathing, Genitourinary:  External: Normal external female genitalia.  Normal urethral meatus, normal Bartholin's and Skene's glands.    Vagina: Normal vaginal mucosa, no evidence of prolapse.   Neurologic: Grossly intact Psychiatric: mood appropriate, affect full  TVUS EMS 1.45cm the previously imaged GS is no longer present  Assessment: 25 y.o. [id number] [redacted]w[redacted]d presenting for evaluation of first trimester vaginal bleeding  Plan: Problem List Items Addressed This Visit       Other   Supervision of other  normal pregnancy, antepartum - Primary   Relevant Orders   POC Urinalysis Dipstick OB (Completed)   Other Visit Diagnoses     Complete abortion           1)  Condolences were offered to the patient and her family.  I stressed that while emotionally difficult, that this did not occur because of an actions or inactions by the patient.  Somewhere between 10-20% of identified first trimester pregnancies will unfortunately end in miscarriage.  Given this relatively high incidence rate, further diagnostic testing such as chromosome analysis is generally not clinically relevant nor recommended.  Although the chromosomal abnormalities have been implicated at rates as high as 70% in some studies, these are generally random and do not infer and increased risk of recurrence with subsequent pregnancies.  However, 3 or more consecutive first trimester losses are relatively uncommon, and these patient generally do benefit from additional work up to determine a potential modifiable etiology.   We briefly discussed management options including expectant management, medical management, and surgical management as well as their relative success rates and complications. Approximately 80% of first trimester miscarriages will pass successfully but may require a time frame of up to 8 weeks (ACOG Practice Bulletin 150 May 2015 "Early Pregnancy Loss").    Endometrial stripe less than 64mm and absence of gestation sac is generally indicative of completed abortion ACOG Practice Bulletin 150 May 2015 reaffirmed 2017 "Early Pregnancy Loss"  Studies have shown need for surgical intervention after mifepristone use with ultarsound showing absence of GS 1 week after administration required surgical intervention at a rate of 1.6% ACOG Practice Bulletin 143 March 2014"Medical Management of First Trimester Abortion"   3) The patient is A positive  rhogam is therefore not indicated to decrease the risk rhesus alloimmunization.     4) Routine bleeding precautions were discussed with the patient prior the conclusion of today's visit.    Vena Austria, MD, [facility], [facility] 06/11/2021, 10:29 AM

## 2021-06-11 NOTE — Progress Notes (Signed)
ROB - dating scan, still bleeding and cramping, [place] 1

## 2021-06-28 ENCOUNTER — Telehealth: Payer: Medicaid Other | Admitting: Physician Assistant

## 2021-06-28 DIAGNOSIS — H66001 Acute suppurative otitis media without spontaneous rupture of ear drum, right ear: Secondary | ICD-10-CM | POA: Diagnosis not present

## 2021-06-28 DIAGNOSIS — H60391 Other infective otitis externa, right ear: Secondary | ICD-10-CM

## 2021-06-28 MED ORDER — AMOXICILLIN 500 MG PO CAPS: 500.0000 mg | ORAL_CAPSULE | Freq: Two times a day (BID) | ORAL | 0 refills | Status: AC

## 2021-06-28 MED ORDER — NEOMYCIN-POLYMYXIN-HC 3.5-10000-1 OT SOLN: 3.0000 [drp] | Freq: Four times a day (QID) | OTIC | 0 refills | Status: DC

## 2021-06-28 NOTE — Progress Notes (Signed)
Virtual Visit Consent   Samantha Vaughan, you are scheduled for a virtual visit with a [facility] provider today.     Just as with appointments in the office, your consent must be obtained to participate.  Your consent will be active for this visit and any virtual visit you may have with one of our providers in the next 365 days.     If you have a MyChart account, a copy of this consent can be sent to you electronically.  All virtual visits are billed to your insurance company just like a traditional visit in the office.    As this is a virtual visit, video technology does not allow for your provider to perform a traditional examination.  This may limit your provider's ability to fully assess your condition.  If your provider identifies any concerns that need to be evaluated in person or the need to arrange testing (such as labs, EKG, etc.), we will make arrangements to do so.     Although advances in technology are sophisticated, we cannot ensure that it will always work on either your end or our end.  If the connection with a video visit is poor, the visit may have to be switched to a telephone visit.  With either a video or telephone visit, we are not always able to ensure that we have a secure connection.     I need to obtain your verbal consent now.   Are you willing to proceed with your visit today?    Samantha Vaughan has provided verbal consent on 06/28/2021 for a virtual visit (video or telephone).   Margaretann Loveless, PA-C   Date: 06/28/2021 3:08 PM   Virtual Visit via Video Note   I, Margaretann Loveless, connected with  Samantha Vaughan  ([id number], 1995/09/22) on 06/28/21 at  3:00 PM EDT by a video-enabled telemedicine application and verified that I am speaking with the correct person using two identifiers.  Location: Patient: Virtual Visit Location Patient: Home Provider: Virtual Visit Location Provider: Home Office   I discussed the limitations of evaluation and management by  telemedicine and the availability of in person appointments. The patient expressed understanding and agreed to proceed.    History of Present Illness: Samantha Vaughan is a 25 y.o. who identifies as a female who was assigned female at birth, and is being seen today for right ear pain. Started having ear drainage about 2-3 weeks ago, clear to green, but now having pain of the ear and radiating into the right jaw. Denies fevers, chills, nausea, vomiting.   *Of note: chart is showing patient is pregnant. She, unfortunately, had a miscarriage last month.  HPI: HPI  Problems:  Patient Active Problem List   Diagnosis Date Noted   Current smoker 06/04/2018   Obesity (BMI 30-39.9) 06/04/2018    Allergies: No Known Allergies Medications:  Current Outpatient Medications:    amoxicillin (AMOXIL) 500 MG capsule, Take 1 capsule (500 mg total) by mouth 2 (two) times daily for 10 days., Disp: 14 capsule, Rfl: 0   neomycin-polymyxin-hydrocortisone (CORTISPORIN) OTIC solution, Place 3 drops into the right ear 4 (four) times daily., Disp: 10 mL, Rfl: 0   famotidine (PEPCID) 20 MG tablet, Take 1 tablet (20 mg total) by mouth 2 (two) times daily. (Patient not taking: No sig reported), Disp: 60 tablet, Rfl: 2   Prenatal Vit-Fe Fumarate-FA (MULTIVITAMIN-PRENATAL) 27-0.8 MG TABS tablet, Take 1 tablet by mouth daily at 12 noon., Disp: , Rfl:   Observations/Objective:  Patient is well-developed, well-nourished in no acute distress.  Resting comfortably at home.  Head is normocephalic, atraumatic.  No labored breathing.  Speech is clear and coherent with logical content.  Patient is alert and oriented at baseline.    Assessment and Plan: 1. Other infective acute otitis externa of right ear - neomycin-polymyxin-hydrocortisone (CORTISPORIN) OTIC solution; Place 3 drops into the right ear 4 (four) times daily.  Dispense: 10 mL; Refill: 0  2. Non-recurrent acute suppurative otitis media of right ear without  spontaneous rupture of tympanic membrane - amoxicillin (AMOXIL) 500 MG capsule; Take 1 capsule (500 mg total) by mouth 2 (two) times daily for 10 days.  Dispense: 14 capsule; Refill: 0  - Suspect otitis externa transitioning to otitis media - Cortisporin drops and amoxicillin prescribed - Tylenol prn pain/fevers - Cool compresses to ear/right side of face - Seek in person evaluation if symptoms worsen or fail to improve  Follow Up Instructions: I discussed the assessment and treatment plan with the patient. The patient was provided an opportunity to ask questions and all were answered. The patient agreed with the plan and demonstrated an understanding of the instructions.  A copy of instructions were sent to the patient via MyChart unless otherwise noted below.    The patient was advised to call back or seek an in-person evaluation if the symptoms worsen or if the condition fails to improve as anticipated.  Time:  I spent 10 minutes with the patient via telehealth technology discussing the above problems/concerns.    Margaretann Loveless, PA-C

## 2021-06-28 NOTE — Patient Instructions (Signed)
Benedict Needy, thank you for joining Margaretann Loveless, PA-C for today's virtual visit.  While this provider is not your primary care provider (PCP), if your PCP is located in our provider database this encounter information will be shared with them immediately following your visit.  Consent: (Patient) Samantha Vaughan provided verbal consent for this virtual visit at the beginning of the encounter.  Current Medications:  Current Outpatient Medications:    amoxicillin (AMOXIL) 500 MG capsule, Take 1 capsule (500 mg total) by mouth 2 (two) times daily for 10 days., Disp: 14 capsule, Rfl: 0   neomycin-polymyxin-hydrocortisone (CORTISPORIN) OTIC solution, Place 3 drops into the right ear 4 (four) times daily., Disp: 10 mL, Rfl: 0   famotidine (PEPCID) 20 MG tablet, Take 1 tablet (20 mg total) by mouth 2 (two) times daily. (Patient not taking: No sig reported), Disp: 60 tablet, Rfl: 2   Prenatal Vit-Fe Fumarate-FA (MULTIVITAMIN-PRENATAL) 27-0.8 MG TABS tablet, Take 1 tablet by mouth daily at 12 noon., Disp: , Rfl:    Medications ordered in this encounter:  Meds ordered this encounter  Medications   neomycin-polymyxin-hydrocortisone (CORTISPORIN) OTIC solution    Sig: Place 3 drops into the right ear 4 (four) times daily.    Dispense:  10 mL    Refill:  0    Order Specific Question:   Supervising Provider    Answer:   MILLER, BRIAN [[id number]]   amoxicillin (AMOXIL) 500 MG capsule    Sig: Take 1 capsule (500 mg total) by mouth 2 (two) times daily for 10 days.    Dispense:  14 capsule    Refill:  0    Order Specific Question:   Supervising Provider    Answer:   Hyacinth Meeker, BRIAN [[id number]]     *If you need refills on other medications prior to your next appointment, please contact your pharmacy*  Follow-Up: Call back or seek an in-person evaluation if the symptoms worsen or if the condition fails to improve as anticipated.  Other Instructions Otitis Externa Otitis externa is an infection of the  outer ear canal. The outer ear canal is the area between the outside of the ear and the eardrum. Otitis externa is sometimes called swimmer's ear. What are the causes? Common causes of this condition include: Swimming in dirty water. Moisture in the ear. An injury to the inside of the ear. An object stuck in the ear. A cut or scrape on the outside of the ear or in the ear canal. What increases the risk? You are more likely to develop this condition if you go swimming often. What are the signs or symptoms? The first symptom of this condition is often itching in the ear. Later symptoms of the condition include: Swelling of the ear. Redness in the ear. Ear pain. The pain may get worse when you pull on your ear. Pus coming from the ear. How is this diagnosed? This condition may be diagnosed by examining the ear and testing fluid from the ear for bacteria and funguses. How is this treated? This condition may be treated with: Antibiotic ear drops. These are often given for 10-14 days. Medicines to reduce itching and swelling. Follow these instructions at home: If you were prescribed antibiotic ear drops, use them as told by your health care provider. Do not stop using the antibiotic even if you start to feel better. Take over-the-counter and prescription medicines only as told by your health care provider. Avoid getting water in your ears as told by your  health care provider. This may include avoiding swimming or water sports for a few days. Keep all follow-up visits. This is important. How is this prevented? Keep your ears dry. Use the corner of a towel to dry your ears after you swim or bathe. Avoid scratching or putting things in your ear. Doing these things can damage the ear canal or remove the protective wax that lines it, which makes it easier for bacteria and funguses to grow. Avoid swimming in lakes, polluted water, or swimming pools that may not have enough chlorine. Contact a  health care provider if: You have a fever. Your ear is still red, swollen, painful, or draining pus after 3 days. Your redness, swelling, or pain gets worse. You have a severe headache. Get help right away if: You have redness, swelling, and pain or tenderness in the area behind your ear. Summary Otitis externa is an infection of the outer ear canal. Common causes include swimming in dirty water, moisture in the ear, or a cut or scrape in the ear. Symptoms include pain, redness, and swelling of the ear canal. If you were prescribed antibiotic ear drops, use them as told by your health care provider. Do not stop using the antibiotic even if you start to feel better. This information is not intended to replace advice given to you by your health care provider. Make sure you discuss any questions you have with your health care provider. Document Revised: 11/14/2020 Document Reviewed: 11/14/2020 Elsevier Patient Education  2022 [employer].    If you have been instructed to have an in-person evaluation today at a local Urgent Care facility, please use the link below. It will take you to a list of all of our available [facility], including address, phone number and hours of operation. Please do not delay care.  [facility]  If you or a family member do not have a primary care provider, use the link below to schedule a visit and establish care. When you choose a [facility] primary care physician or advanced practice provider, you gain a long-term partner in health. Find a Primary Care Provider  Learn more about [facility]'s in-office and virtual care options: [facility] - Get Care Now

## 2021-07-24 ENCOUNTER — Ambulatory Visit: Payer: Medicaid Other | Admitting: Obstetrics and Gynecology

## 2021-09-23 DIAGNOSIS — A084 Viral intestinal infection, unspecified: Secondary | ICD-10-CM | POA: Diagnosis not present

## 2021-09-23 DIAGNOSIS — O219 Vomiting of pregnancy, unspecified: Secondary | ICD-10-CM | POA: Diagnosis not present

## 2021-09-23 DIAGNOSIS — Z3A01 Less than 8 weeks gestation of pregnancy: Secondary | ICD-10-CM | POA: Diagnosis not present

## 2021-09-23 DIAGNOSIS — Z20822 Contact with and (suspected) exposure to covid-19: Secondary | ICD-10-CM | POA: Diagnosis not present

## 2021-09-23 DIAGNOSIS — O98811 Other maternal infectious and parasitic diseases complicating pregnancy, first trimester: Secondary | ICD-10-CM | POA: Diagnosis not present

## 2021-09-24 ENCOUNTER — Telehealth: Payer: Self-pay

## 2021-09-24 NOTE — Telephone Encounter (Signed)
Transition Care Management Follow-up Telephone Call Date of discharge and from where: 09/23/2021 from [facility] How have you been since you were released from the hospital? Pt stated that she still feeling the same.  Any questions or concerns? No  Items Reviewed: Did the pt receive and understand the discharge instructions provided? Yes  Medications obtained and verified? Yes  Other? No  Any new allergies since your discharge? No  Dietary orders reviewed? No Do you have support at home? Yes   Functional Questionnaire: (I = Independent and D = Dependent) ADLs: I Bathing/Dressing- I Meal Prep- I Eating- I Maintaining continence- I Transferring/Ambulation- I Managing Meds- I   Follow up appointments reviewed: PCP Hospital f/u appt confirmed? No   Specialist Hospital f/u appt confirmed? No   Are transportation arrangements needed? No  If their condition worsens, is the pt aware to call PCP or go to the Emergency Dept.? Yes Was the patient provided with contact information for the PCP's office or ED? Yes Was to pt encouraged to call back with questions or concerns? Yes

## 2021-10-21 DIAGNOSIS — Z3201 Encounter for pregnancy test, result positive: Secondary | ICD-10-CM | POA: Diagnosis not present

## 2021-10-21 DIAGNOSIS — R110 Nausea: Secondary | ICD-10-CM | POA: Diagnosis not present

## 2021-10-21 DIAGNOSIS — N912 Amenorrhea, unspecified: Secondary | ICD-10-CM | POA: Diagnosis not present

## 2021-10-21 DIAGNOSIS — Z369 Encounter for antenatal screening, unspecified: Secondary | ICD-10-CM | POA: Diagnosis not present

## 2021-12-25 DIAGNOSIS — Z2821 Immunization not carried out because of patient refusal: Secondary | ICD-10-CM | POA: Diagnosis not present

## 2021-12-25 DIAGNOSIS — G8929 Other chronic pain: Secondary | ICD-10-CM | POA: Diagnosis not present

## 2021-12-25 DIAGNOSIS — M5441 Lumbago with sciatica, right side: Secondary | ICD-10-CM | POA: Diagnosis not present

## 2021-12-25 DIAGNOSIS — O99352 Diseases of the nervous system complicating pregnancy, second trimester: Secondary | ICD-10-CM | POA: Diagnosis not present

## 2021-12-25 DIAGNOSIS — Z3A19 19 weeks gestation of pregnancy: Secondary | ICD-10-CM | POA: Diagnosis not present

## 2021-12-25 DIAGNOSIS — Z7185 Encounter for immunization safety counseling: Secondary | ICD-10-CM | POA: Diagnosis not present

## 2021-12-25 DIAGNOSIS — Z3689 Encounter for other specified antenatal screening: Secondary | ICD-10-CM | POA: Diagnosis not present

## 2021-12-25 DIAGNOSIS — O99891 Other specified diseases and conditions complicating pregnancy: Secondary | ICD-10-CM | POA: Diagnosis not present

## 2021-12-25 DIAGNOSIS — Z3A20 20 weeks gestation of pregnancy: Secondary | ICD-10-CM | POA: Diagnosis not present

## 2021-12-29 DIAGNOSIS — O99282 Endocrine, nutritional and metabolic diseases complicating pregnancy, second trimester: Secondary | ICD-10-CM | POA: Diagnosis not present

## 2021-12-29 DIAGNOSIS — O219 Vomiting of pregnancy, unspecified: Secondary | ICD-10-CM | POA: Diagnosis not present

## 2021-12-29 DIAGNOSIS — Z3A20 20 weeks gestation of pregnancy: Secondary | ICD-10-CM | POA: Diagnosis not present

## 2021-12-29 DIAGNOSIS — E876 Hypokalemia: Secondary | ICD-10-CM | POA: Diagnosis not present

## 2021-12-29 DIAGNOSIS — O99332 Smoking (tobacco) complicating pregnancy, second trimester: Secondary | ICD-10-CM | POA: Diagnosis not present

## 2021-12-29 DIAGNOSIS — F17210 Nicotine dependence, cigarettes, uncomplicated: Secondary | ICD-10-CM | POA: Diagnosis not present

## 2021-12-30 ENCOUNTER — Telehealth: Payer: Self-pay

## 2021-12-30 ENCOUNTER — Emergency Department (HOSPITAL_COMMUNITY)
Admission: EM | Admit: 2021-12-30 | Discharge: 2021-12-31 | Discharge status: Against Medical Advice | Payer: Medicaid Other | Attending: Emergency Medicine | Admitting: Emergency Medicine

## 2021-12-30 ENCOUNTER — Other Ambulatory Visit: Payer: Self-pay

## 2021-12-30 ENCOUNTER — Encounter (HOSPITAL_COMMUNITY): Payer: Self-pay

## 2021-12-30 DIAGNOSIS — R12 Heartburn: Secondary | ICD-10-CM | POA: Insufficient documentation

## 2021-12-30 DIAGNOSIS — R1110 Vomiting, unspecified: Secondary | ICD-10-CM | POA: Diagnosis not present

## 2021-12-30 DIAGNOSIS — Z5321 Procedure and treatment not carried out due to patient leaving prior to being seen by health care provider: Secondary | ICD-10-CM | POA: Diagnosis not present

## 2021-12-30 DIAGNOSIS — Z3A20 20 weeks gestation of pregnancy: Secondary | ICD-10-CM | POA: Diagnosis not present

## 2021-12-30 DIAGNOSIS — O211 Hyperemesis gravidarum with metabolic disturbance: Secondary | ICD-10-CM | POA: Diagnosis not present

## 2021-12-30 NOTE — Telephone Encounter (Signed)
Transition Care Management Unsuccessful Follow-up Telephone Call ? ?Date of discharge and from where:  12/29/2021-[name]  ? ?Attempts:  1st Attempt ? ?Reason for unsuccessful TCM follow-up call:  Left voice message ? ?  ?

## 2021-12-30 NOTE — ED Triage Notes (Signed)
Emesis for 3 days. ?Went to [facility] and only got fluids.  ?EDD 05/15/2022 Goes to [facility] in [place]. Called them and they said to go to the ER  ?Pain from heartburn and vomiting.  ? ?

## 2021-12-31 DIAGNOSIS — O211 Hyperemesis gravidarum with metabolic disturbance: Secondary | ICD-10-CM | POA: Diagnosis not present

## 2021-12-31 DIAGNOSIS — Z3A20 20 weeks gestation of pregnancy: Secondary | ICD-10-CM | POA: Diagnosis not present

## 2021-12-31 NOTE — Telephone Encounter (Signed)
Transition Care Management Unsuccessful Follow-up Telephone Call ? ?Date of discharge and from where:  12/29/2021-[name]  ? ?Attempts:  2nd Attempt ? ?Reason for unsuccessful TCM follow-up call:  Left voice message ? ?  ?

## 2022-01-01 NOTE — Telephone Encounter (Signed)
Transition Care Management Follow-up Telephone Call ?Date of discharge and from where: 12/29/2021-[facility]  ?How have you been since you were released from the hospital? Pt stated she is doing fine.  ?Any questions or concerns? No ? ?Items Reviewed: ?Did the pt receive and understand the discharge instructions provided? Yes  ?Medications obtained and verified? Yes  ?Other? No  ?Any new allergies since your discharge? No  ?Dietary orders reviewed? No ?Do you have support at home? Yes  ? ?Home Care and Equipment/Supplies: ?Were home health services ordered? not applicable ?If so, what is the name of the agency? N/A  ?Has the agency set up a time to come to the patient's home? not applicable ?Were any new equipment or medical supplies ordered?  No ?What is the name of the medical supply agency? N/A ?Were you able to get the supplies/equipment? not applicable ?Do you have any questions related to the use of the equipment or supplies? No ? ?Functional Questionnaire: (I = Independent and D = Dependent) ?ADLs: I ? ?Bathing/Dressing- I ? ?Meal Prep- I ? ?Eating- I ? ?Maintaining continence- I ? ?Transferring/Ambulation- I ? ?Managing Meds- I ? ?Follow up appointments reviewed: ? ?PCP Hospital f/u appt confirmed? No   ?Specialist Hospital f/u appt confirmed? No   ?Are transportation arrangements needed? No  ?If their condition worsens, is the pt aware to call PCP or go to the Emergency Dept.? Yes ?Was the patient provided with contact information for the PCP's office or ED? Yes ?Was to pt encouraged to call back with questions or concerns? Yes  ?

## 2022-01-09 DIAGNOSIS — Z3A22 22 weeks gestation of pregnancy: Secondary | ICD-10-CM | POA: Diagnosis not present

## 2022-01-09 DIAGNOSIS — O09523 Supervision of elderly multigravida, third trimester: Secondary | ICD-10-CM | POA: Diagnosis not present

## 2022-01-09 DIAGNOSIS — G8929 Other chronic pain: Secondary | ICD-10-CM | POA: Diagnosis not present

## 2022-01-09 DIAGNOSIS — M5441 Lumbago with sciatica, right side: Secondary | ICD-10-CM | POA: Diagnosis not present

## 2022-01-09 DIAGNOSIS — Z3689 Encounter for other specified antenatal screening: Secondary | ICD-10-CM | POA: Diagnosis not present

## 2022-02-19 DIAGNOSIS — Z3689 Encounter for other specified antenatal screening: Secondary | ICD-10-CM | POA: Diagnosis not present

## 2022-02-19 DIAGNOSIS — Z23 Encounter for immunization: Secondary | ICD-10-CM | POA: Diagnosis not present

## 2022-02-24 DIAGNOSIS — O99810 Abnormal glucose complicating pregnancy: Secondary | ICD-10-CM | POA: Diagnosis not present

## 2022-03-25 IMAGING — US US PELVIS COMPLETE WITH TRANSVAGINAL
1 series · 13 of 25 positions shown · non-contrast
Comparison: CT 12/25/2019

CLINICAL DATA: Ovarian cyst right lower quadrant pain

EXAM:
TRANSABDOMINAL AND TRANSVAGINAL ULTRASOUND OF PELVIS
TECHNIQUE: Both transabdominal and transvaginal ultrasound examinations of the
pelvis were performed. Transabdominal technique was performed for
global imaging of the pelvis including uterus, ovaries, adnexal
regions, and pelvic cul-de-sac. It was necessary to proceed with
endovaginal exam following the transabdominal exam to visualize the
uterus endometrium ovaries.

[Series 1: us pelvis complete with transvaginal · 0.22mm/px · 13 of 126 slices shown]
[im 1/126]
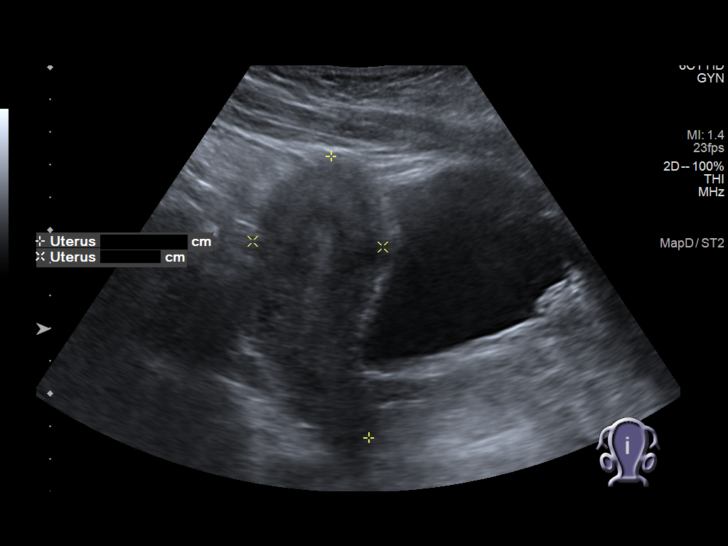
[im 11/126]
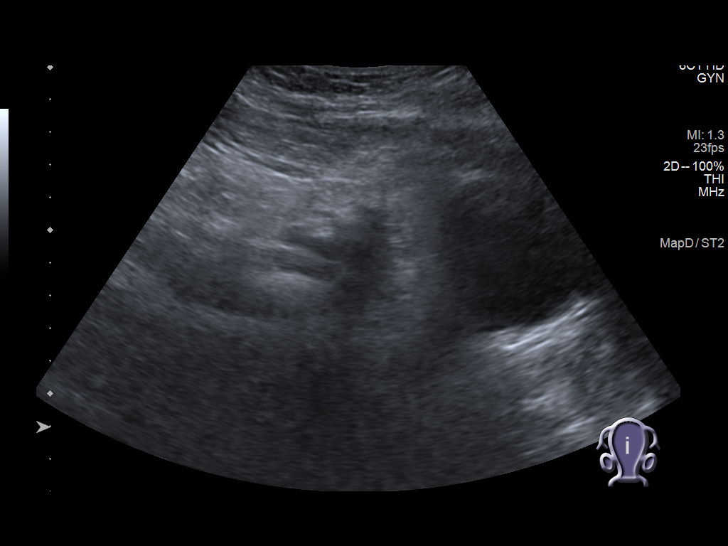
[im 21/126]
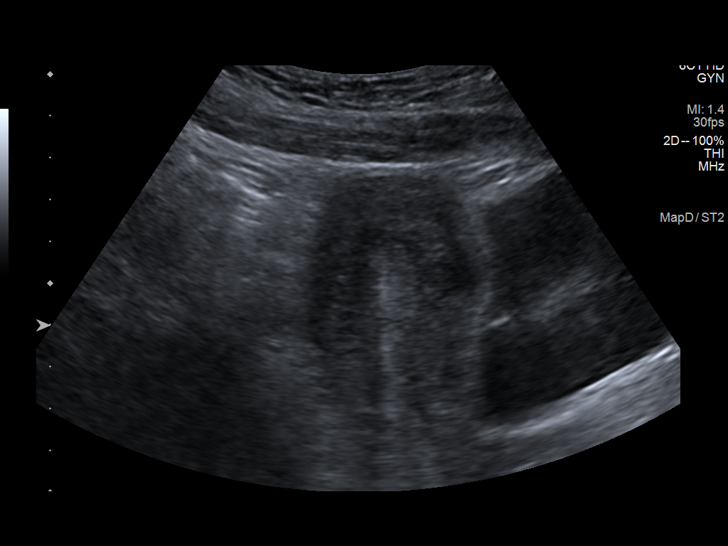
[im 32/126]
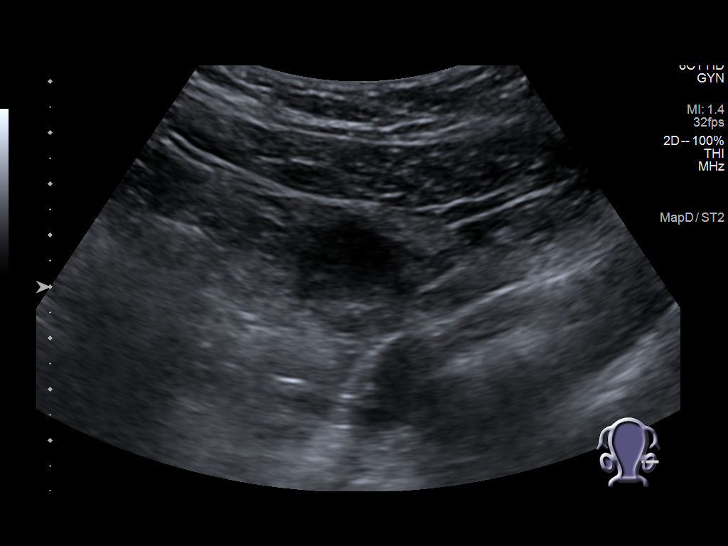
[im 42/126]
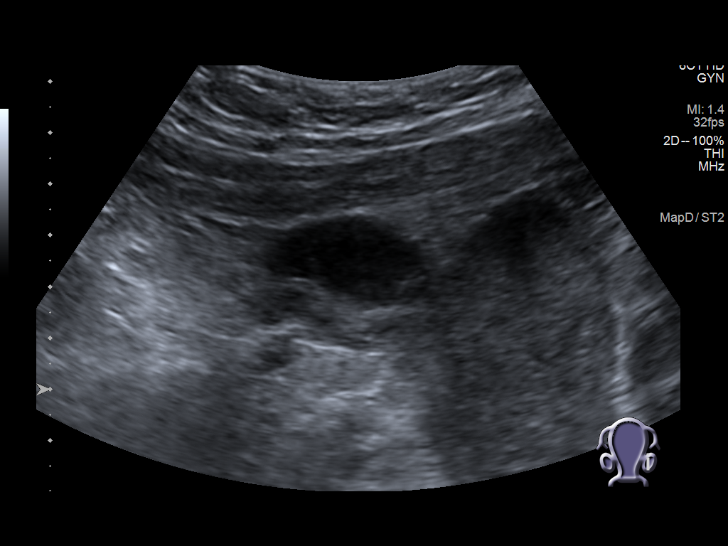
[im 53/126]
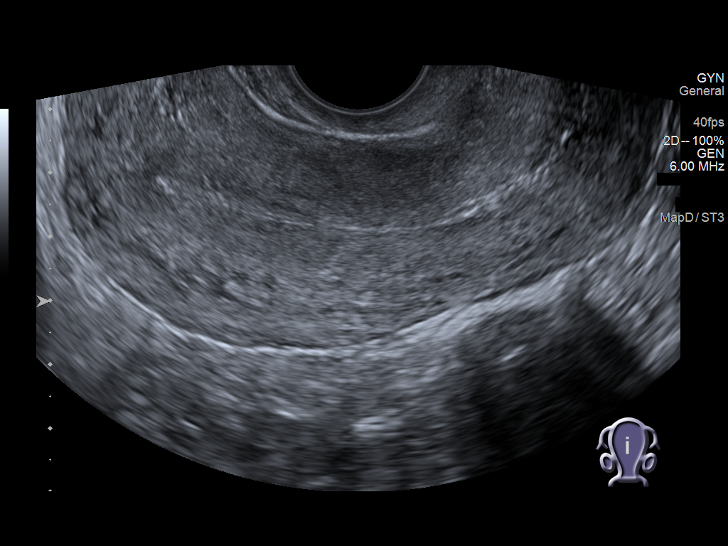
[im 63/126]
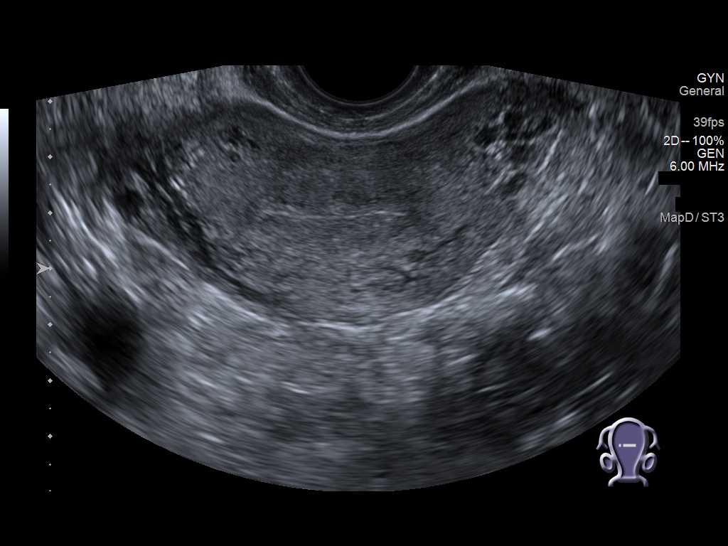
[im 73/126]
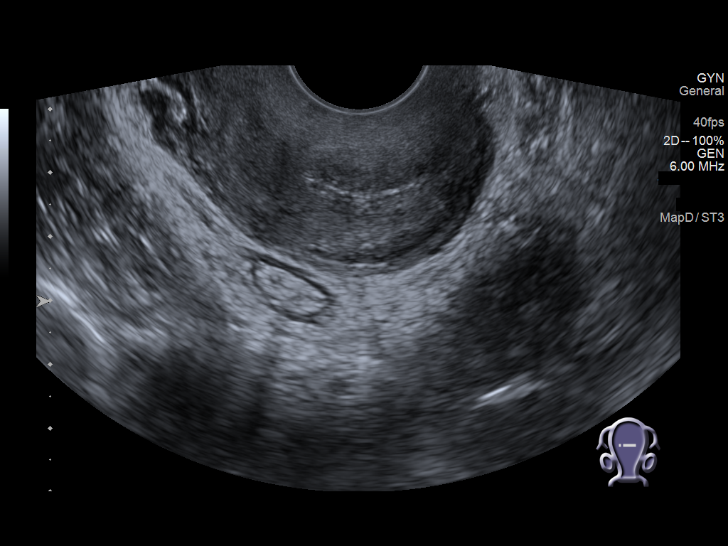
[im 84/126]
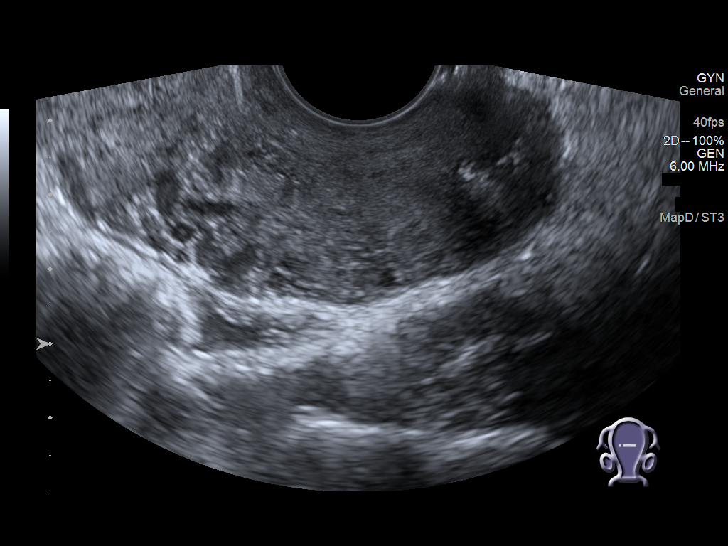
[im 94/126]
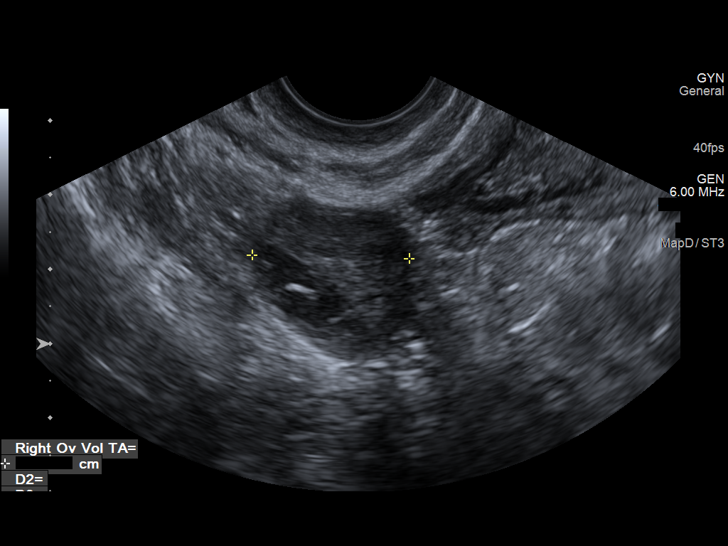
[im 105/126]
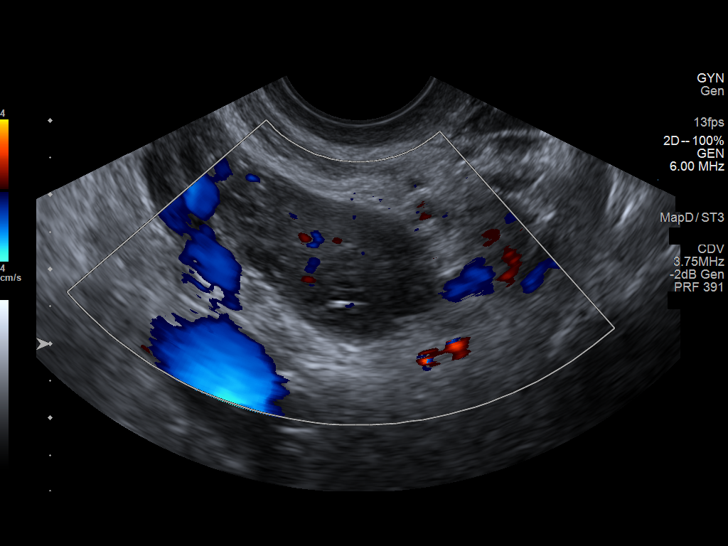
[im 115/126]
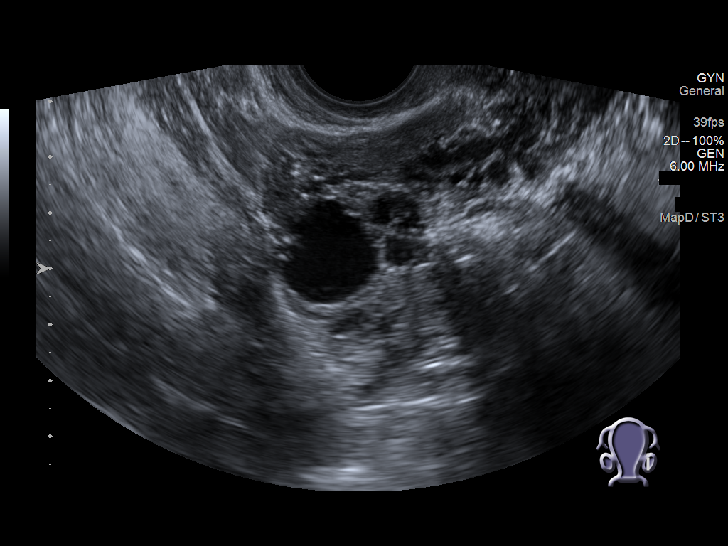
[im 126/126]
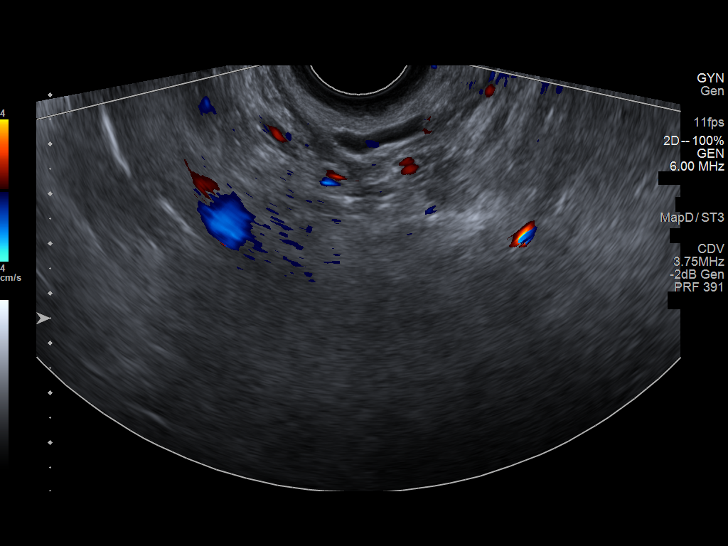

[13 of 25 positions shown; findings below may reference images not displayed]

FINDINGS: Uterus

Measurements: 9.9 x 4 x 6.6 cm = volume: 139 mL. Small hypoechoic
mass within the anterior uterine fundus, subserosal measuring 1.3 x
0.8 x 1.3 cm.

Endometrium

Thickness: 7.1 mm.  No focal abnormality visualized.

Right ovary

Measurements: 3.2 x 1.8 x 2.1 cm = volume: 6 mL. Normal
appearance/no adnexal mass.

Left ovary

Measurements: 3.9 x 2.8 x 2.8 cm = volume: 17 mL. Cyst or dominant
follicle measuring 2 cm. No suspicious left ovarian lesion.

Other findings

Small free fluid in the pelvis.
IMPRESSION: 1. No suspicious left ovarian lesion. Small cyst or dominant
follicle, not clinically significant.
2. Small free fluid in the pelvis
3. Probable small uterine fibroid

## 2022-04-14 DIAGNOSIS — Z3685 Encounter for antenatal screening for Streptococcus B: Secondary | ICD-10-CM | POA: Diagnosis not present

## 2022-04-14 DIAGNOSIS — O26843 Uterine size-date discrepancy, third trimester: Secondary | ICD-10-CM | POA: Diagnosis not present

## 2022-04-22 DIAGNOSIS — Z3689 Encounter for other specified antenatal screening: Secondary | ICD-10-CM | POA: Diagnosis not present

## 2022-04-22 DIAGNOSIS — Z3A36 36 weeks gestation of pregnancy: Secondary | ICD-10-CM | POA: Diagnosis not present

## 2022-05-09 DIAGNOSIS — Z3A39 39 weeks gestation of pregnancy: Secondary | ICD-10-CM | POA: Diagnosis not present

## 2022-05-09 DIAGNOSIS — O99334 Smoking (tobacco) complicating childbirth: Secondary | ICD-10-CM | POA: Diagnosis not present

## 2022-05-09 DIAGNOSIS — F17210 Nicotine dependence, cigarettes, uncomplicated: Secondary | ICD-10-CM | POA: Diagnosis not present

## 2022-05-10 DIAGNOSIS — Z3A39 39 weeks gestation of pregnancy: Secondary | ICD-10-CM | POA: Diagnosis not present

## 2023-06-30 IMAGING — US US OB < 14 WEEKS - US OB TV
1 series · 15 of 28 positions shown · non-contrast
Comparison: None.

CLINICAL DATA: Vaginal bleeding affecting early pregnancy.

EXAM:
OBSTETRIC <14 WK US AND TRANSVAGINAL OB US
TECHNIQUE: Both transabdominal and transvaginal ultrasound examinations were
performed for complete evaluation of the gestation as well as the
maternal uterus, adnexal regions, and pelvic cul-de-sac.
Transvaginal technique was performed to assess early pregnancy.

[Series 1: us ob comp less 14 wks · 15 of 97 slices shown]
[im 1/97]
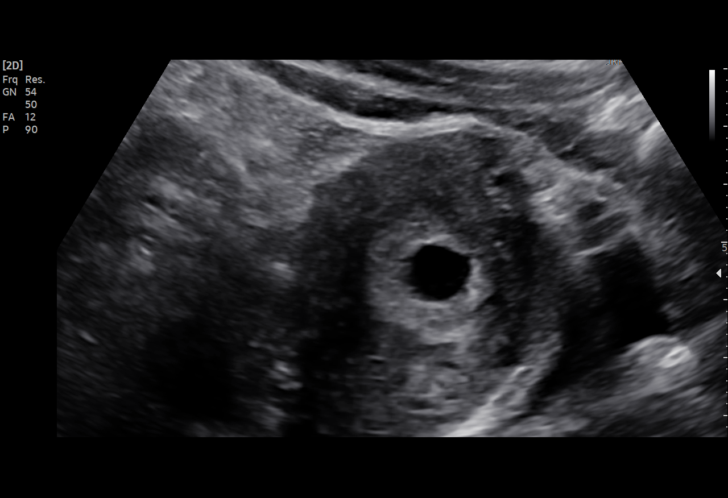
[im 8/97]
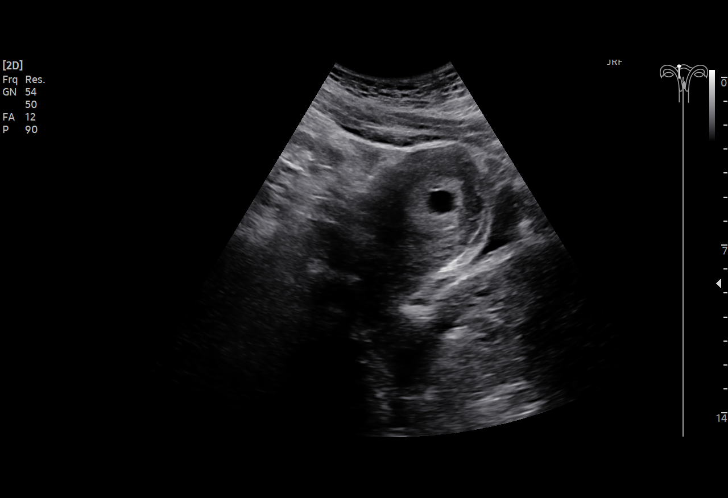
[im 15/97]
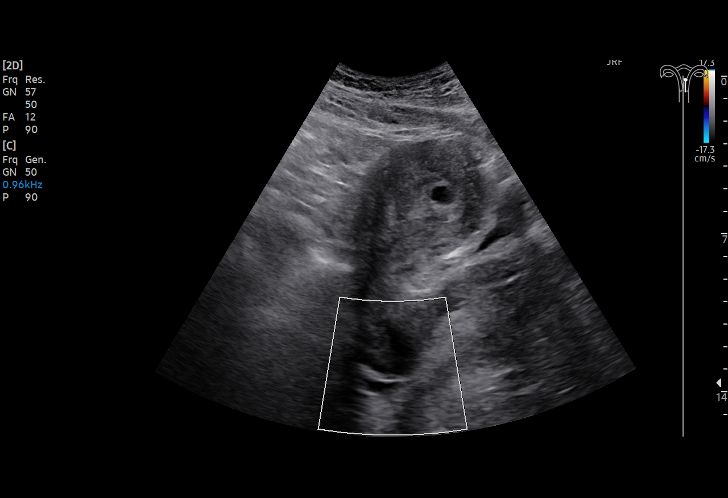
[im 22/97]
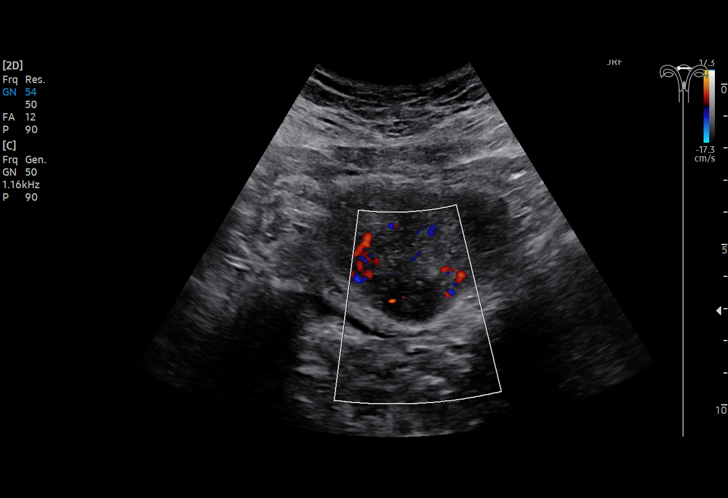
[im 29/97]
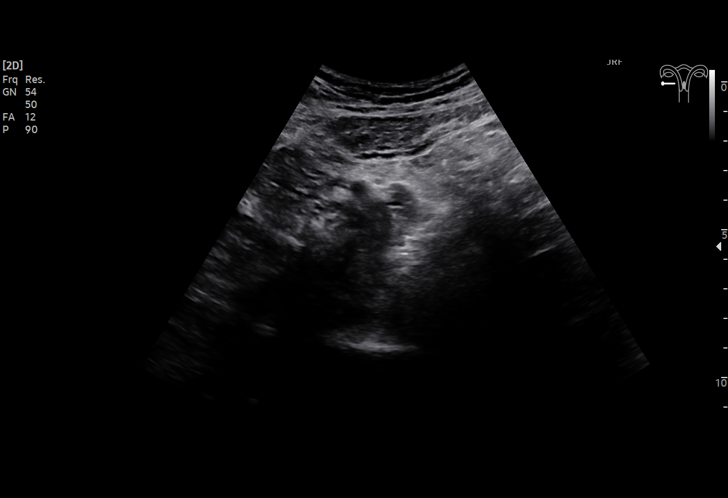
[im 36/97]
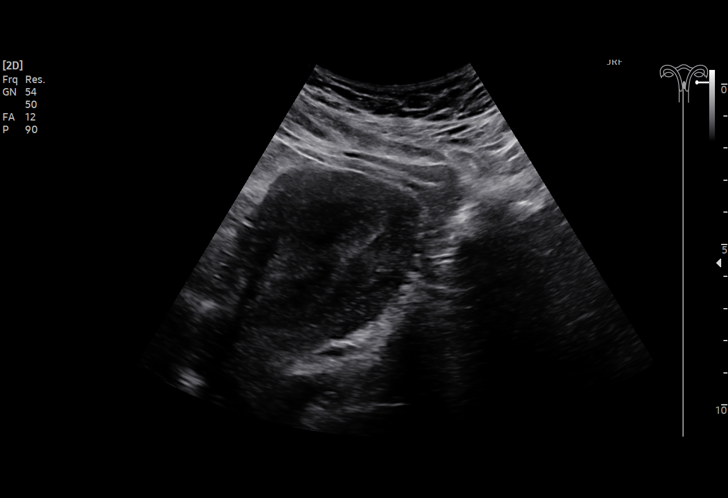
[im 43/97]
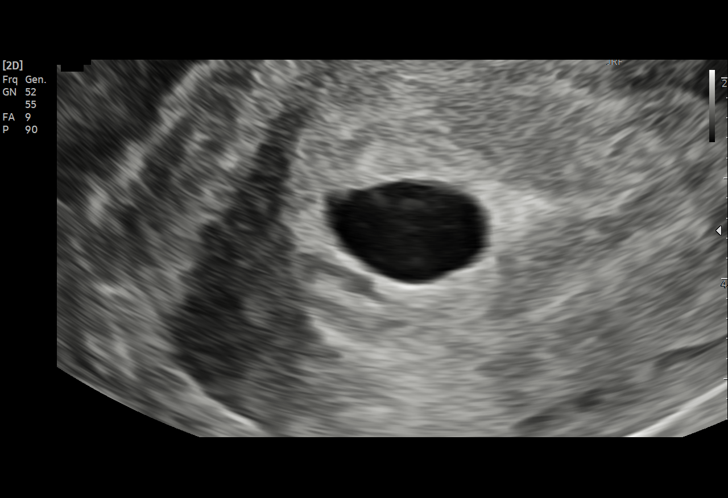
[im 50/97]
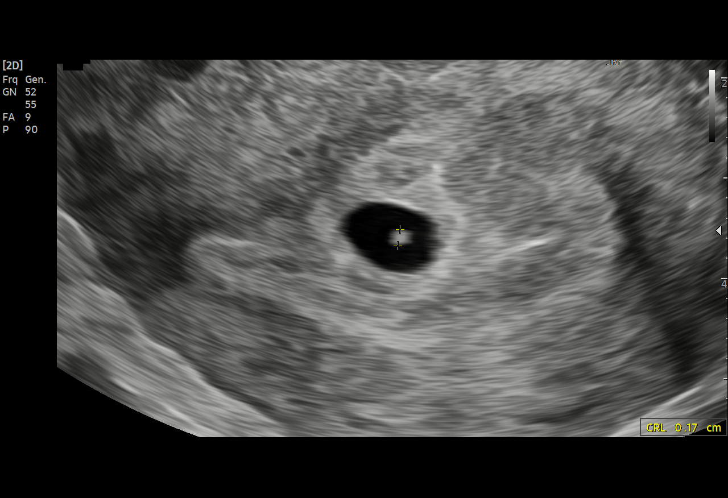
[im 54/97]
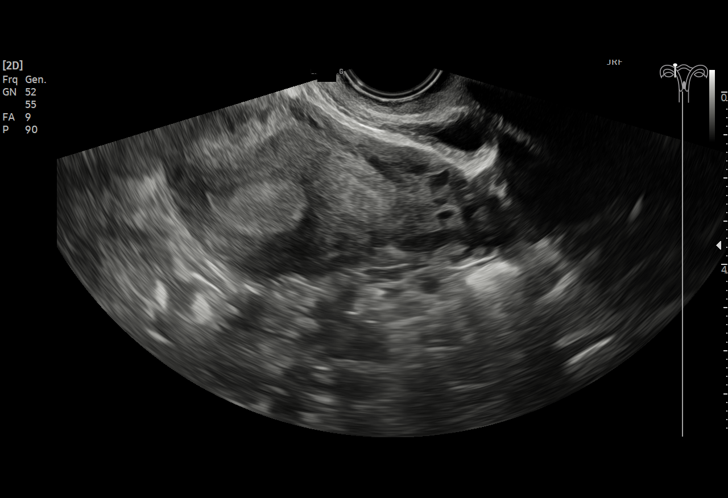
[im 61/97]
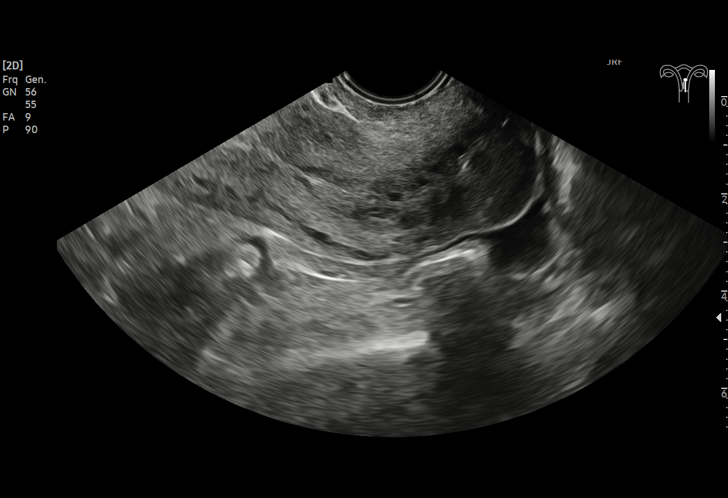
[im 68/97]
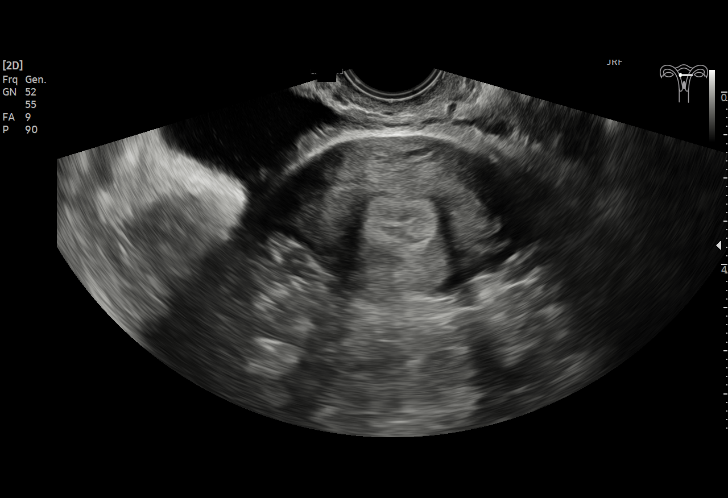
[im 75/97]
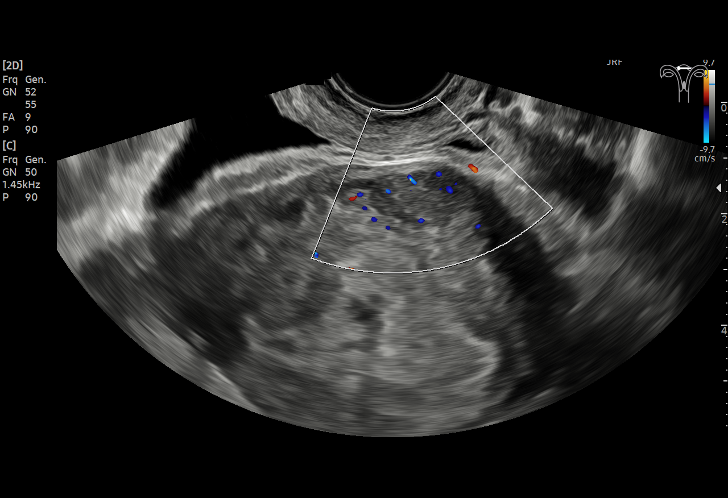
[im 82/97]
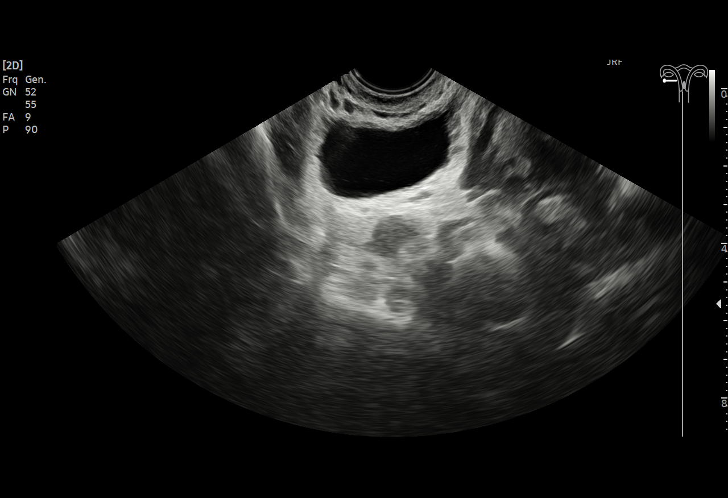
[im 89/97]
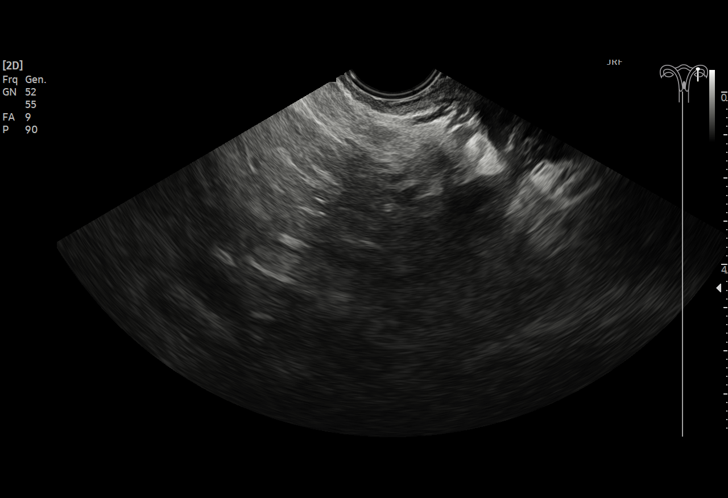
[im 97/97]
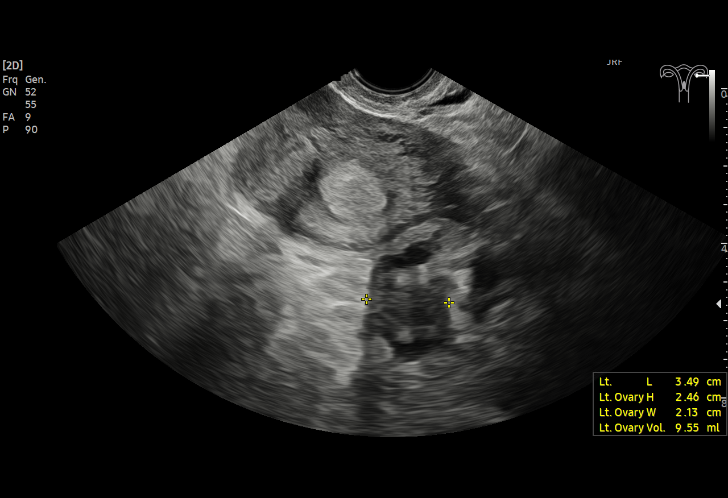

[15 of 28 positions shown; findings below may reference images not displayed]

FINDINGS: Intrauterine gestational sac: Single

Yolk sac:  Yes

Embryo:  Yes

Cardiac Activity: Not visualized

MSD: 12.3 mm   6 w   0 d

CRL: 1.7 mm (fetal pole is too small to determine gestational age)

Subchorionic hemorrhage:  None visualized.

Maternal uterus/adnexae:

Subchorionic hemorrhage: None

Right ovary: Normal

Left ovary: Normal

Other :Small uterine fibroid measures 1.8 cm.

Free fluid:  Trace free fluid
IMPRESSION: 1. There is a single intrauterine gestational sac containing a small
yolk sac and small fetal pole. No cardiac activity identified within
the suspected fetal pole. Findings are suspicious but not yet
definitive for failed pregnancy. Recommend follow-up US in 10-14
days for definitive diagnosis. This recommendation follows SRU
consensus guidelines: Diagnostic Criteria for Nonviable Pregnancy
Early in the First Trimester. N Engl J Med 6741; [DATE].

## 2024-02-14 ENCOUNTER — Emergency Department
Admission: EM | Admit: 2024-02-14 | Discharge: 2024-02-14 | Disposition: A | Discharge status: Home / Self Care | Attending: Emergency Medicine | Admitting: Emergency Medicine

## 2024-02-14 ENCOUNTER — Other Ambulatory Visit: Payer: Self-pay

## 2024-02-14 DIAGNOSIS — R112 Nausea with vomiting, unspecified: Secondary | ICD-10-CM | POA: Diagnosis present

## 2024-02-14 LAB — COMPREHENSIVE METABOLIC PANEL WITH GFR
ALT: 42 U/L (ref 0–44)
AST: 22 U/L (ref 15–41)
Albumin: 5.3 g/dL — ABNORMAL HIGH (ref 3.5–5.0)
Alkaline Phosphatase: 82 U/L (ref 38–126)
Anion gap: 15 (ref 5–15)
BUN: 13 mg/dL (ref 6–20)
CO2: 25 mmol/L (ref 22–32)
Calcium: 10.2 mg/dL (ref 8.9–10.3)
Chloride: 99 mmol/L (ref 98–111)
Creatinine, Ser: 0.77 mg/dL (ref 0.44–1.00)
GFR, Estimated: >60 mL/min (ref >60)
Glucose, Bld: 121 mg/dL — ABNORMAL HIGH (ref 70–99)
Potassium: 3.4 mmol/L — ABNORMAL LOW (ref 3.5–5.1)
Sodium: 139 mmol/L (ref 135–145)
Total Bilirubin: 0.8 mg/dL (ref 0.0–1.2)
Total Protein: 8.4 g/dL — ABNORMAL HIGH (ref 6.5–8.1)

## 2024-02-14 LAB — URINALYSIS, ROUTINE W REFLEX MICROSCOPIC
Bilirubin Urine: NEGATIVE
Glucose, UA: NEGATIVE mg/dL
Hgb urine dipstick: NEGATIVE
Ketones, ur: 80 mg/dL — AB
Leukocytes,Ua: NEGATIVE
Nitrite: NEGATIVE
Protein, ur: 100 mg/dL — AB
Specific Gravity, Urine: 1.029 (ref 1.005–1.030)
pH: 6 (ref 5.0–8.0)

## 2024-02-14 LAB — CBC WITH DIFFERENTIAL/PLATELET
Abs Immature Granulocytes: 0.04 10*3/uL (ref 0.00–0.07)
Basophils Absolute: 0 10*3/uL (ref 0.0–0.1)
Basophils Relative: 0 %
Eosinophils Absolute: 0 10*3/uL (ref 0.0–0.5)
Eosinophils Relative: 0 %
HCT: 44.3 % (ref 36.0–46.0)
Hemoglobin: 15.8 g/dL — ABNORMAL HIGH (ref 12.0–15.0)
Immature Granulocytes: 0 %
Lymphocytes Relative: 11 %
Lymphs Abs: 1.8 10*3/uL (ref 0.7–4.0)
MCH: 31 pg (ref 26.0–34.0)
MCHC: 35.7 g/dL (ref 30.0–36.0)
MCV: 87 fL (ref 80.0–100.0)
Monocytes Absolute: 1 10*3/uL (ref 0.1–1.0)
Monocytes Relative: 6 %
Neutro Abs: 13.3 10*3/uL — ABNORMAL HIGH (ref 1.7–7.7)
Neutrophils Relative %: 83 %
Platelets: 363 10*3/uL (ref 150–400)
RBC: 5.09 MIL/uL (ref 3.87–5.11)
RDW: 11.2 % — ABNORMAL LOW (ref 11.5–15.5)
WBC: 16.1 10*3/uL — ABNORMAL HIGH (ref 4.0–10.5)
nRBC: 0 % (ref 0.0–0.2)

## 2024-02-14 LAB — POC URINE PREG, ED: Preg Test, Ur: NEGATIVE

## 2024-02-14 LAB — LIPASE, BLOOD: Lipase: 27 U/L (ref 11–51)

## 2024-02-14 MED ORDER — DROPERIDOL 2.5 MG/ML IJ SOLN
1.2500 mg | Freq: Once | INTRAMUSCULAR | Status: AC
  Administered 2024-02-14: 1.25 mg via INTRAVENOUS
  Filled 2024-02-14: qty 2

## 2024-02-14 MED ORDER — LACTATED RINGERS IV BOLUS
1000.0000 mL | Freq: Once | INTRAVENOUS | Status: AC
  Administered 2024-02-14: 1000 mL via INTRAVENOUS

## 2024-02-14 MED ORDER — ONDANSETRON HCL 4 MG PO TABS: 4.0000 mg | ORAL_TABLET | Freq: Three times a day (TID) | ORAL | 0 refills | Status: DC | PRN

## 2024-02-14 NOTE — Discharge Instructions (Addendum)
 This may have been related to something you ate or a viral GI infection.  Also, there is something called cannabinoid hyperemesis syndrome where people actually experience increased nausea and vomiting related to marijuana use.  If this is what is causing your symptoms, then the only long-term treatment is decreasing the amount of marijuana use you have.  I have sent nausea medication to your pharmacy to use as needed.

## 2024-02-14 NOTE — ED Provider Notes (Signed)
 [facility] Provider Note    Event Date/Time   First MD Initiated Contact with Patient 02/14/24 1107     (approximate)   History   Nausea   HPI Samantha Vaughan is a 28 y.o. female presenting today for nausea.  Patient states starting yesterday morning she has had nausea with vomiting.  She otherwise denies abdominal pain, cough, congestion, body aches, diarrhea, constipation, dysuria.  Does note daily marijuana use.  Also notes that hot showers tend to help her symptoms.     Physical Exam   Triage Vital Signs: ED Triage Vitals  Encounter Vitals Group     BP 02/14/24 1049 127/60     Systolic BP Percentile --      Diastolic BP Percentile --      Pulse Rate 02/14/24 1049 64     Resp 02/14/24 1049 16     Temp 02/14/24 1049 98.7 F (37.1 C)     Temp Source 02/14/24 1049 Oral     SpO2 02/14/24 1049 99 %     Weight 02/14/24 1042 180 lb (81.6 kg)     Height 02/14/24 1042 5\' 2"  (1.575 m)     Head Circumference --      Peak Flow --      Pain Score --      Pain Loc --      Pain Education --      Exclude from Growth Chart --     Most recent vital signs: Vitals:   02/14/24 1049  BP: 127/60  Pulse: 64  Resp: 16  Temp: 98.7 F (37.1 C)  SpO2: 99%   Physical Exam: I have reviewed the vital signs and nursing notes. General: Awake, alert, no acute distress.  Nontoxic appearing. Head:  Atraumatic, normocephalic.   ENT:  EOM intact, PERRL. Oral mucosa is pink and moist with no lesions. Neck: Neck is supple with full range of motion, No meningeal signs. Cardiovascular:  RRR, No murmurs. Peripheral pulses palpable and equal bilaterally. Respiratory:  Symmetrical chest wall expansion.  No rhonchi, rales, or wheezes.  Good air movement throughout.  No use of accessory muscles.   Musculoskeletal:  No cyanosis or edema. Moving extremities with full ROM Abdomen:  Soft, nontender, nondistended. Neuro:  GCS 15, moving all four extremities, interacting  appropriately. Speech clear. Psych:  Calm, appropriate.   Skin:  Warm, dry, no rash.    ED Results / Procedures / Treatments   Labs (all labs ordered are listed, but only abnormal results are displayed) Labs Reviewed  URINALYSIS, ROUTINE W REFLEX MICROSCOPIC - Abnormal; Notable for the following components:      Result Value   Color, Urine AMBER (*)    APPearance CLOUDY (*)    Ketones, ur 80 (*)    Protein, ur 100 (*)    Bacteria, UA RARE (*)    All other components within normal limits  CBC WITH DIFFERENTIAL/PLATELET - Abnormal; Notable for the following components:   WBC 16.1 (*)    Hemoglobin 15.8 (*)    RDW 11.2 (*)    Neutro Abs 13.3 (*)    All other components within normal limits  COMPREHENSIVE METABOLIC PANEL WITH GFR - Abnormal; Notable for the following components:   Potassium 3.4 (*)    Glucose, Bld 121 (*)    Total Protein 8.4 (*)    Albumin 5.3 (*)    All other components within normal limits  LIPASE, BLOOD  POC URINE PREG, ED  EKG    RADIOLOGY    PROCEDURES:  Critical Care performed: No  Procedures   MEDICATIONS ORDERED IN ED: Medications  lactated ringers  bolus 1,000 mL (1,000 mLs Intravenous Bolus 02/14/24 1133)  droperidol (INAPSINE) 2.5 MG/ML injection 1.25 mg (1.25 mg Intravenous Given 02/14/24 1133)     IMPRESSION / MDM / ASSESSMENT AND PLAN / ED COURSE  I reviewed the triage vital signs and the nursing notes.                              Differential diagnosis includes, but is not limited to, cannabinoid hyperemesis syndrome, gastroenteritis, viral GI infection, food poisoning  Patient's presentation is most consistent with acute complicated illness / injury requiring diagnostic workup.  Patient is a 28 year old female presenting today for nausea and vomiting over the past 24 hours.  Physical exam largely reassuring with stable vital signs.  Absolutely no abdominal tenderness anywhere.  Pregnancy test is negative.  UA does have  ketones indicating likely dehydration.  She does note daily marijuana use which may indicate cannabinoid hyperemesis syndrome as a source today.  Will collect laboratory workup as well as give 1 L LR and droperidol.  Laboratory workup shows leukocytosis but also elevation in her hemoglobin likely suspecting hemoconcentration.  CMP otherwise normal.  No indication for any CT imaging at this time patient had complete resolution of her symptoms and tolerated p.o.  Will discharge with as needed nausea medication and given strict return precautions.  We did discuss decreasing her marijuana use as this may be contributing as well.     FINAL CLINICAL IMPRESSION(S) / ED DIAGNOSES   Final diagnoses:  Nausea and vomiting, unspecified vomiting type     Rx / DC Orders   ED Discharge Orders          Ordered    ondansetron  (ZOFRAN ) 4 MG tablet  Every 8 hours PRN        02/14/24 1253             Note:  This document was prepared using Dragon voice recognition software and may include unintentional dictation errors.   Kandee Orion, MD 02/14/24 1254

## 2024-02-14 NOTE — ED Notes (Signed)
 Pt is CAOx4, breathing normally, and normal in color. Pt complains of constant vomiting since yesterday morning around 9am. Pt advises she is unable to keep anything down. Pt denies diarrhea or fevers. Pt lying in bed and does not appear to be in any distress.

## 2024-02-14 NOTE — ED Notes (Signed)
 Pt tolerated fluid challenge well and advised she feels much better.

## 2024-02-14 NOTE — ED Triage Notes (Signed)
 Pt comes with c/o nausea. Pt states she hasn't been able to keep anything down. Pt states possible pregnancy. Pt states vomiting since yesterday.

## 2024-02-14 NOTE — ED Notes (Signed)
 Pt given water for fluid challenge per Dr. Karlynn Oyster.

## 2024-02-15 ENCOUNTER — Emergency Department
Admission: EM | Admit: 2024-02-15 | Discharge: 2024-02-15 | Disposition: A | Discharge status: Home / Self Care | Attending: Emergency Medicine | Admitting: Emergency Medicine

## 2024-02-15 ENCOUNTER — Encounter: Payer: Self-pay | Admitting: Emergency Medicine

## 2024-02-15 ENCOUNTER — Other Ambulatory Visit: Payer: Self-pay

## 2024-02-15 DIAGNOSIS — R112 Nausea with vomiting, unspecified: Secondary | ICD-10-CM | POA: Diagnosis present

## 2024-02-15 MED ORDER — ONDANSETRON 4 MG PO TBDP
8.0000 mg | ORAL_TABLET | Freq: Once | ORAL | Status: AC
  Administered 2024-02-15: 8 mg via ORAL
  Filled 2024-02-15: qty 2

## 2024-02-15 MED ORDER — ONDANSETRON 8 MG PO TBDP
8.0000 mg | ORAL_TABLET | Freq: Three times a day (TID) | ORAL | 0 refills | Status: AC | PRN
Start: 2024-02-15 — End: ?

## 2024-02-15 NOTE — ED Provider Notes (Signed)
 [facility] Provider Note   Event Date/Time   First MD Initiated Contact with Patient 02/15/24 0430     (approximate) History  Emesis  HPI Samantha Vaughan is a 28 y.o. female with a past medical history of marijuana abuse who presents for the second time in 24 hours complaining of nausea/vomiting.  Patient was seen earlier today, given droperidol, and prescribed Zofran  however she has not picked it up from the pharmacy.  Patient states that she woke this morning and continued to have nausea/vomiting.  Patient denies any change in symptoms from previous visit ROS: Patient currently denies any vision changes, tinnitus, difficulty speaking, facial droop, sore throat, chest pain, shortness of breath, diarrhea, dysuria, or weakness/numbness/paresthesias in any extremity   Physical Exam  Triage Vital Signs: ED Triage Vitals  Encounter Vitals Group     BP 02/15/24 0431 (!) 141/72     Systolic BP Percentile --      Diastolic BP Percentile --      Pulse Rate 02/15/24 0431 (!) 52     Resp 02/15/24 0431 18     Temp 02/15/24 0431 97.7 F (36.5 C)     Temp Source 02/15/24 0431 Oral     SpO2 02/15/24 0431 97 %     Weight --      Height --      Head Circumference --      Peak Flow --      Pain Score 02/15/24 0426 0     Pain Loc --      Pain Education --      Exclude from Growth Chart --    Most recent vital signs: Vitals:   02/15/24 0431 02/15/24 0541  BP: (!) 141/72 138/74  Pulse: (!) 52 (!) 58  Resp: 18 18  Temp: 97.7 F (36.5 C) 97.9 F (36.6 C)  SpO2: 97% 98%   General: Awake, oriented x4. CV:  Good peripheral perfusion. Resp:  Normal effort. Abd:  No distention. Other:  Young adult obese Caucasian female resting comfortably in no acute distress ED Results / Procedures / Treatments  Labs (all labs ordered are listed, but only abnormal results are displayed) Labs Reviewed - No data to display PROCEDURES: Critical Care performed:  No Procedures MEDICATIONS ORDERED IN ED: Medications  ondansetron  (ZOFRAN -ODT) disintegrating tablet 8 mg (8 mg Oral Given 02/15/24 0435)   IMPRESSION / MDM / ASSESSMENT AND PLAN / ED COURSE  I reviewed the triage vital signs and the nursing notes.                             The patient is on the cardiac monitor to evaluate for evidence of arrhythmia and/or significant heart rate changes. Patient's presentation is most consistent with acute presentation with potential threat to life or bodily function. Patient presents for acute nausea/vomiting The cause of the patient's symptoms is not clear, but the patient is overall well appearing and is suspected to have a transient course of illness.  Given History and Exam there does not appear to be an emergent cause of the symptoms such as small bowel obstruction, coronary syndrome, bowel ischemia, DKA, pancreatitis, appendicitis, other acute abdomen or other emergent problem.  Reassessment: After treatment, the patient is feeling much better, tolerating PO fluids, and shows no signs of dehydration.  Patient was instructed to stop all cannabinoid intake  Disposition: Discharge home with prompt primary care physician follow up in the  next 48 hours. Strict return precautions discussed.   FINAL CLINICAL IMPRESSION(S) / ED DIAGNOSES   Final diagnoses:  Nausea and vomiting, unspecified vomiting type   Rx / DC Orders   ED Discharge Orders          Ordered    ondansetron  (ZOFRAN -ODT) 8 MG disintegrating tablet  Every 8 hours PRN        02/15/24 0533           Note:  This document was prepared using Dragon voice recognition software and may include unintentional dictation errors.   Wynona Duhamel K, MD 02/15/24 [phone]

## 2024-02-15 NOTE — ED Triage Notes (Signed)
 Pt arrived via POV with reports of severe nausea, states seen yesterday for the same, continues to  have nausea, did not get RX meds yet. Took dramamine.   Pt states she was able to keep things down earlier.

## 2024-02-15 NOTE — ED Notes (Signed)
 Patient has been drinking water with no ill effect, and has been able to keep it down.  She says that she feels better.

## 2024-02-20 ENCOUNTER — Encounter: Payer: Self-pay | Admitting: Emergency Medicine

## 2024-02-20 ENCOUNTER — Other Ambulatory Visit: Payer: Self-pay

## 2024-02-20 DIAGNOSIS — D72829 Elevated white blood cell count, unspecified: Secondary | ICD-10-CM | POA: Insufficient documentation

## 2024-02-20 DIAGNOSIS — E876 Hypokalemia: Secondary | ICD-10-CM | POA: Insufficient documentation

## 2024-02-20 DIAGNOSIS — R112 Nausea with vomiting, unspecified: Secondary | ICD-10-CM | POA: Diagnosis present

## 2024-02-20 DIAGNOSIS — E860 Dehydration: Secondary | ICD-10-CM | POA: Diagnosis not present

## 2024-02-20 LAB — CBC
HCT: 41.8 % (ref 36.0–46.0)
Hemoglobin: 15.5 g/dL — ABNORMAL HIGH (ref 12.0–15.0)
MCH: 31.1 pg (ref 26.0–34.0)
MCHC: 37.1 g/dL — ABNORMAL HIGH (ref 30.0–36.0)
MCV: 83.8 fL (ref 80.0–100.0)
Platelets: 313 10*3/uL (ref 150–400)
RBC: 4.99 MIL/uL (ref 3.87–5.11)
RDW: 11 % — ABNORMAL LOW (ref 11.5–15.5)
WBC: 10.6 10*3/uL — ABNORMAL HIGH (ref 4.0–10.5)
nRBC: 0 % (ref 0.0–0.2)

## 2024-02-20 NOTE — ED Triage Notes (Signed)
 Arrived pov for vomiting, was seen here on last week for the same  Hasn't been able to keep anything down since yesterday

## 2024-02-21 ENCOUNTER — Emergency Department

## 2024-02-21 ENCOUNTER — Emergency Department
Admission: EM | Admit: 2024-02-21 | Discharge: 2024-02-21 | Disposition: A | Discharge status: Home / Self Care | Attending: Emergency Medicine | Admitting: Emergency Medicine

## 2024-02-21 DIAGNOSIS — E860 Dehydration: Secondary | ICD-10-CM

## 2024-02-21 DIAGNOSIS — R112 Nausea with vomiting, unspecified: Secondary | ICD-10-CM

## 2024-02-21 LAB — COMPREHENSIVE METABOLIC PANEL WITH GFR
ALT: 159 U/L — ABNORMAL HIGH (ref 0–44)
AST: 62 U/L — ABNORMAL HIGH (ref 15–41)
Albumin: 4.4 g/dL (ref 3.5–5.0)
Alkaline Phosphatase: 87 U/L (ref 38–126)
Anion gap: 15 (ref 5–15)
BUN: 11 mg/dL (ref 6–20)
CO2: 22 mmol/L (ref 22–32)
Calcium: 9.4 mg/dL (ref 8.9–10.3)
Chloride: 98 mmol/L (ref 98–111)
Creatinine, Ser: 0.71 mg/dL (ref 0.44–1.00)
GFR, Estimated: >60 mL/min (ref >60)
Glucose, Bld: 135 mg/dL — ABNORMAL HIGH (ref 70–99)
Potassium: 3.1 mmol/L — ABNORMAL LOW (ref 3.5–5.1)
Sodium: 135 mmol/L (ref 135–145)
Total Bilirubin: 1.3 mg/dL — ABNORMAL HIGH (ref 0.0–1.2)
Total Protein: 7.3 g/dL (ref 6.5–8.1)

## 2024-02-21 LAB — URINALYSIS, ROUTINE W REFLEX MICROSCOPIC
Bacteria, UA: NONE SEEN
Bilirubin Urine: NEGATIVE
Glucose, UA: NEGATIVE mg/dL
Ketones, ur: 80 mg/dL — AB
Leukocytes,Ua: NEGATIVE
Nitrite: NEGATIVE
Protein, ur: NEGATIVE mg/dL
Specific Gravity, Urine: >1.046 — ABNORMAL HIGH (ref 1.005–1.030)
pH: 9 — ABNORMAL HIGH (ref 5.0–8.0)

## 2024-02-21 LAB — HCG, QUANTITATIVE, PREGNANCY: hCG, Beta Chain, Quant, S: <1 m[IU]/mL (ref <5)

## 2024-02-21 LAB — MAGNESIUM: Magnesium: 2.1 mg/dL (ref 1.7–2.4)

## 2024-02-21 LAB — LIPASE, BLOOD: Lipase: 31 U/L (ref 11–51)

## 2024-02-21 MED ORDER — DICYCLOMINE HCL 20 MG PO TABS: 20.0000 mg | ORAL_TABLET | Freq: Three times a day (TID) | ORAL | 0 refills | Status: AC | PRN

## 2024-02-21 MED ORDER — POTASSIUM CHLORIDE 10 MEQ/100ML IV SOLN
10.0000 meq | INTRAVENOUS | Status: AC
  Administered 2024-02-21 (×2): 10 meq via INTRAVENOUS
  Filled 2024-02-21 (×2): qty 100

## 2024-02-21 MED ORDER — ONDANSETRON 4 MG PO TBDP
4.0000 mg | ORAL_TABLET | Freq: Four times a day (QID) | ORAL | 0 refills | Status: AC | PRN
Start: 2024-02-21 — End: ?

## 2024-02-21 MED ORDER — SODIUM CHLORIDE 0.9 % IV BOLUS (SEPSIS)
1000.0000 mL | Freq: Once | INTRAVENOUS | Status: AC
  Administered 2024-02-21: 1000 mL via INTRAVENOUS

## 2024-02-21 MED ORDER — IOHEXOL 300 MG/ML  SOLN
100.0000 mL | Freq: Once | INTRAMUSCULAR | Status: AC | PRN
  Administered 2024-02-21: 100 mL via INTRAVENOUS

## 2024-02-21 MED ORDER — DROPERIDOL 2.5 MG/ML IJ SOLN
2.5000 mg | Freq: Once | INTRAMUSCULAR | Status: AC
  Administered 2024-02-21: 2.5 mg via INTRAVENOUS
  Filled 2024-02-21: qty 2

## 2024-02-21 MED ORDER — PROMETHAZINE HCL 12.5 MG RE SUPP: 12.5000 mg | Freq: Four times a day (QID) | RECTAL | 0 refills | Status: AC | PRN

## 2024-02-21 NOTE — ED Provider Notes (Signed)
 [facility] Provider Note    Event Date/Time   First MD Initiated Contact with Patient 02/21/24 [phone]     (approximate)   History   Emesis   HPI  Samantha Vaughan is a 28 y.o. female with history of cannabis use who presents to the emergency department with nausea, vomiting, abdominal soreness.  No fevers, diarrhea, dysuria, hematuria, vaginal bleeding or discharge.  No sick contacts or recent travel.  No prior abdominal surgeries.  This is her third visit this week for similar symptoms.  Has Zofran  at home which has not been helping.  Reports she does smoke marijuana but has not smoked in 2 weeks.  Denies prior history of cannabinoid hyperemesis syndrome.  Does not have a gastroenterologist.  States she has never had these issues before.  No history of diabetes, gastroparesis.   History provided by patient, mother.    Past Medical History:  Diagnosis Date   No known health problems     Past Surgical History:  Procedure Laterality Date   NO PAST SURGERIES      MEDICATIONS:  Prior to Admission medications   Medication Sig Start Date End Date Taking? Authorizing Provider  famotidine  (PEPCID ) 20 MG tablet Take 1 tablet (20 mg total) by mouth 2 (two) times daily. Patient not taking: No sig reported 12/22/19   Zorita Hiss, FNP  neomycin -polymyxin-hydrocortisone (CORTISPORIN) OTIC solution Place 3 drops into the right ear 4 (four) times daily. 06/28/21   Angelia Kelp, PA-C  ondansetron  (ZOFRAN -ODT) 8 MG disintegrating tablet Take 1 tablet (8 mg total) by mouth every 8 (eight) hours as needed for nausea or vomiting. 02/15/24   Bradler, Evan K, MD  Prenatal Vit-Fe Fumarate-FA (MULTIVITAMIN-PRENATAL) 27-0.8 MG TABS tablet Take 1 tablet by mouth daily at 12 noon.    [provider]    Physical Exam   Triage Vital Signs: ED Triage Vitals [02/20/24 2348]  Encounter Vitals Group     BP (!) 146/79     Systolic BP Percentile      Diastolic BP  Percentile      Pulse Rate 65     Resp 19     Temp 97.8 F (36.6 C)     Temp Source Oral     SpO2 100 %     Weight 160 lb (72.6 kg)     Height 5\' 2"  (1.575 m)     Head Circumference      Peak Flow      Pain Score      Pain Loc      Pain Education      Exclude from Growth Chart     Most recent vital signs: Vitals:   02/21/24 0342 02/21/24 0435  BP: 133/77 117/66  Pulse:  95  Resp:  18  Temp:    SpO2:  98%    CONSTITUTIONAL: Alert, responds appropriately to questions. Well-appearing; well-nourished HEAD: Normocephalic, atraumatic EYES: Conjunctivae clear, pupils appear equal, sclera nonicteric ENT: normal nose; moist mucous membranes NECK: Supple, normal ROM CARD: RRR; S1 and S2 appreciated RESP: Normal chest excursion without splinting or tachypnea; breath sounds clear and equal bilaterally; no wheezes, no rhonchi, no rales, no hypoxia or respiratory distress, speaking full sentences ABD/GI: Non-distended; soft, mild tenderness diffusely, no guarding or rebound, no significant tenderness at McBurney's point BACK: The back appears normal EXT: Normal ROM in all joints; no deformity noted, no edema SKIN: Normal color for age and race; warm; no rash on exposed  skin NEURO: Moves all extremities equally, normal speech PSYCH: The patient's mood and manner are appropriate.   ED Results / Procedures / Treatments   LABS: (all labs ordered are listed, but only abnormal results are displayed) Labs Reviewed  COMPREHENSIVE METABOLIC PANEL WITH GFR - Abnormal; Notable for the following components:      Result Value   Potassium 3.1 (*)    Glucose, Bld 135 (*)    AST 62 (*)    ALT 159 (*)    Total Bilirubin 1.3 (*)    All other components within normal limits  CBC - Abnormal; Notable for the following components:   WBC 10.6 (*)    Hemoglobin 15.5 (*)    MCHC 37.1 (*)    RDW 11.0 (*)    All other components within normal limits  URINALYSIS, ROUTINE W REFLEX MICROSCOPIC -  Abnormal; Notable for the following components:   Color, Urine YELLOW (*)    APPearance CLEAR (*)    Specific Gravity, Urine >1.046 (*)    pH 9.0 (*)    Hgb urine dipstick SMALL (*)    Ketones, ur 80 (*)    All other components within normal limits  LIPASE, BLOOD  MAGNESIUM  HCG, QUANTITATIVE, PREGNANCY     EKG:  EKG Interpretation Date/Time:  Sunday February 21 2024 01:16:37 EDT Ventricular Rate:  65 PR Interval:  78 QRS Duration:  97 QT Interval:  440 QTC Calculation: 458 R Axis:   72  Text Interpretation: Sinus rhythm Short PR interval Borderline repolarization abnormality Confirmed by Verneda Golder [phone]) on 02/21/2024 1:27:46 AM         RADIOLOGY: My personal review and interpretation of imaging: Right upper quadrant ultrasound unremarkable.  CT abdomen pelvis shows no acute abnormality.  I have personally reviewed all radiology reports.   CT ABDOMEN PELVIS W CONTRAST Result Date: 02/21/2024 CLINICAL DATA:  Acute abdominal pain and vomiting, initial encounter EXAM: CT ABDOMEN AND PELVIS WITH CONTRAST TECHNIQUE: Multidetector CT imaging of the abdomen and pelvis was performed using the standard protocol following bolus administration of intravenous contrast. RADIATION DOSE REDUCTION: This exam was performed according to the departmental dose-optimization program which includes automated exposure control, adjustment of the mA and/or kV according to patient size and/or use of iterative reconstruction technique. CONTRAST:  100mL OMNIPAQUE IOHEXOL 300 MG/ML  SOLN COMPARISON:  None Available. FINDINGS: Lower chest: No acute abnormality. Hepatobiliary: No focal liver abnormality is seen. No gallstones, gallbladder wall thickening, or biliary dilatation. Pancreas: Unremarkable. No pancreatic ductal dilatation or surrounding inflammatory changes. Spleen: Normal in size without focal abnormality. Adrenals/Urinary Tract: Adrenal glands are within normal limits bilaterally. Kidneys demonstrate  a normal enhancement pattern without renal calculi or obstructive changes. The ureters are within normal limits. The bladder is partially distended. Stomach/Bowel: No obstructive or inflammatory changes of the colon are seen. The appendix is within normal limits. Small bowel and stomach are within normal limits. Vascular/Lymphatic: No significant vascular findings are present. No enlarged abdominal or pelvic lymph nodes. Reproductive: Uterus and bilateral adnexa are unremarkable. Ring device is noted within the vagina likely related to contraceptive. Other: No abdominal wall hernia or abnormality. No abdominopelvic ascites. Musculoskeletal: No acute or significant osseous findings. IMPRESSION: No acute abnormality is noted to correspond with the given clinical history. Electronically Signed   By: Violeta Grey M.D.   On: 02/21/2024 03:03   US  ABDOMEN LIMITED RUQ (LIVER/GB) Result Date: 02/21/2024 CLINICAL DATA:  Elevated LFTs and vomiting, initial encounter EXAM: ULTRASOUND ABDOMEN  LIMITED RIGHT UPPER QUADRANT COMPARISON:  None Available. FINDINGS: Gallbladder: No gallstones or wall thickening visualized. No sonographic Murphy sign noted by sonographer. Common bile duct: Diameter: 2.4 mm. Liver: No focal lesion identified. Within normal limits in parenchymal echogenicity. Portal vein is patent on color Doppler imaging with normal direction of blood flow towards the liver. Other: None. IMPRESSION: No acute abnormality in the right upper quadrant. Electronically Signed   By: Violeta Grey M.D.   On: 02/21/2024 01:43     PROCEDURES:  Critical Care performed: No     Procedures    IMPRESSION / MDM / ASSESSMENT AND PLAN / ED COURSE  I reviewed the triage vital signs and the nursing notes.    Patient here with nausea, vomiting, generalized abdominal pain.  This is her third visit to the emergency department for the same in the past week.  She does report smoking marijuana but states has not smoked in 2  weeks.  The patient is on the cardiac monitor to evaluate for evidence of arrhythmia and/or significant heart rate changes.   DIFFERENTIAL DIAGNOSIS (includes but not limited to):   Viral gastroenteritis, cyclic vomiting syndrome, cannabinoid hyperemesis syndrome, dehydration, electrolyte derangement, less likely cholecystitis, pancreatitis, colitis, diverticulitis, appendicitis, kidney stone, pyelonephritis   Patient's presentation is most consistent with acute presentation with potential threat to life or bodily function.   PLAN: Will obtain abdominal labs, give droperidol , IV fluids for symptomatic relief.  Will consider imaging if symptoms not improving given this is her third visit to the ED for the same.  Abdominal exam is relatively benign however.   MEDICATIONS GIVEN IN ED: Medications  potassium chloride 10 mEq in 100 mL IVPB (0 mEq Intravenous Stopped 02/21/24 0400)  sodium chloride  0.9 % bolus 1,000 mL (0 mLs Intravenous Stopped 02/21/24 0434)  droperidol  (INAPSINE ) 2.5 MG/ML injection 2.5 mg (2.5 mg Intravenous Given 02/21/24 0133)  iohexol (OMNIPAQUE) 300 MG/ML solution 100 mL (100 mLs Intravenous Contrast Given 02/21/24 0244)  sodium chloride  0.9 % bolus 1,000 mL (0 mLs Intravenous Stopped 02/21/24 0434)     ED COURSE: Labs do show leukocytosis of 10.6.  Potassium of 3.1.  Will give IV replacement.  Mild elevation of her AST, ALT and total bilirubin.  Will obtain right upper quadrant ultrasound.  Lipase normal.  Patient reports feeling better after droperidol .   EKG shows no arrhythmia, interval abnormality.  Normal magnesium.  Right upper quadrant ultrasound reviewed and interpreted by myself and the radiologist and is unremarkable.  Will proceed with CT imaging given recurrent symptoms.   CT scan reviewed and interpreted by myself and the radiologist and shows no acute abnormality.  Normal appendix.  Patient states she is feeling much better, tolerating p.o.  Urine does not appear  infected but does show large ketones, elevated specific gravity consistent with dehydration.  She has received 2 L of IV fluids here in the emergency department.  Patient states she is ready for discharge home.  Will discharge with Zofran  ODT and Phenergan suppositories.  Will give Bentyl as well.  Discussed strict return precautions.  Patient and mother comfortable with this plan.   At this time, I do not feel there is any life-threatening condition present. I reviewed all nursing notes, vitals, pertinent previous records.  All lab and urine results, EKGs, imaging ordered have been independently reviewed and interpreted by myself.  I reviewed all available radiology reports from any imaging ordered this visit.  Based on my assessment, I feel the patient is  safe to be discharged home without further emergent workup and can continue workup as an outpatient as needed. Discussed all findings, treatment plan as well as usual and customary return precautions.  They verbalize understanding and are comfortable with this plan.  Outpatient follow-up has been provided as needed.  All questions have been answered.    CONSULTS: Admission considered but patient states she is feeling better and requesting discharge home.   OUTSIDE RECORDS REVIEWED: Reviewed prior OB/GYN notes in 2023.       FINAL CLINICAL IMPRESSION(S) / ED DIAGNOSES   Final diagnoses:  Nausea and vomiting in adult  Dehydration     Rx / DC Orders   ED Discharge Orders          Ordered    ondansetron  (ZOFRAN -ODT) 4 MG disintegrating tablet  Every 6 hours PRN        02/21/24 0502    dicyclomine (BENTYL) 20 MG tablet  Every 8 hours PRN        02/21/24 0502    promethazine (PHENERGAN) 12.5 MG suppository  Every 6 hours PRN        02/21/24 0502             Note:  This document was prepared using Dragon voice recognition software and may include unintentional dictation errors.   Quanika Solem, Clover Dao, DO 02/21/24 [phone]

## 2024-02-21 NOTE — ED Notes (Signed)
Pt transferred to ct via stretcher.

## 2024-02-22 ENCOUNTER — Encounter: Admitting: Obstetrics & Gynecology

## 2024-02-24 ENCOUNTER — Encounter: Payer: Self-pay | Admitting: Obstetrics & Gynecology

## 2024-10-21 ENCOUNTER — Emergency Department
Admission: EM | Admit: 2024-10-21 | Discharge: 2024-10-21 | Discharge status: Against Medical Advice | Source: Home / Self Care

## 2024-10-21 ENCOUNTER — Other Ambulatory Visit: Payer: Self-pay

## 2024-10-21 LAB — COMPREHENSIVE METABOLIC PANEL WITH GFR
ALT: 44 U/L (ref 0–44)
AST: 22 U/L (ref 15–41)
Albumin: 5.1 g/dL — ABNORMAL HIGH (ref 3.5–5.0)
Alkaline Phosphatase: 102 U/L (ref 38–126)
Anion gap: 17 — ABNORMAL HIGH (ref 5–15)
BUN: 15 mg/dL (ref 6–20)
CO2: 28 mmol/L (ref 22–32)
Calcium: 10 mg/dL (ref 8.9–10.3)
Chloride: 95 mmol/L — ABNORMAL LOW (ref 98–111)
Creatinine, Ser: 0.75 mg/dL (ref 0.44–1.00)
GFR, Estimated: >60 mL/min
Glucose, Bld: 122 mg/dL — ABNORMAL HIGH (ref 70–99)
Potassium: 3.2 mmol/L — ABNORMAL LOW (ref 3.5–5.1)
Sodium: 140 mmol/L (ref 135–145)
Total Bilirubin: 0.5 mg/dL (ref 0.0–1.2)
Total Protein: 7.9 g/dL (ref 6.5–8.1)

## 2024-10-21 LAB — LIPASE, BLOOD: Lipase: 21 U/L (ref 11–51)

## 2024-10-21 LAB — CBC
HCT: 41.4 % (ref 36.0–46.0)
Hemoglobin: 14.9 g/dL (ref 12.0–15.0)
MCH: 30.8 pg (ref 26.0–34.0)
MCHC: 36 g/dL (ref 30.0–36.0)
MCV: 85.7 fL (ref 80.0–100.0)
Platelets: 354 10*3/uL (ref 150–400)
RBC: 4.83 MIL/uL (ref 3.87–5.11)
RDW: 11.5 % (ref 11.5–15.5)
WBC: 10.4 10*3/uL (ref 4.0–10.5)
nRBC: 0 % (ref 0.0–0.2)

## 2024-10-21 NOTE — ED Triage Notes (Signed)
 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 Pt is caox4, in no acute distress and ambulatory to triage.
